# Patient Record
Sex: Female | Born: 1965
Health system: Southern US, Community
[De-identification: ages and names within clinical notes are randomized; demographics above are authoritative.]

## PROBLEM LIST (undated history)

## (undated) DIAGNOSIS — G43909 Migraine, unspecified, not intractable, without status migrainosus: Secondary | ICD-10-CM

## (undated) DIAGNOSIS — T7840XA Allergy, unspecified, initial encounter: Secondary | ICD-10-CM

## (undated) DIAGNOSIS — I10 Essential (primary) hypertension: Secondary | ICD-10-CM

## (undated) DIAGNOSIS — E739 Lactose intolerance, unspecified: Secondary | ICD-10-CM

## (undated) DIAGNOSIS — F329 Major depressive disorder, single episode, unspecified: Secondary | ICD-10-CM

## (undated) DIAGNOSIS — F419 Anxiety disorder, unspecified: Secondary | ICD-10-CM

## (undated) DIAGNOSIS — F32A Depression, unspecified: Secondary | ICD-10-CM

## (undated) DIAGNOSIS — K219 Gastro-esophageal reflux disease without esophagitis: Secondary | ICD-10-CM

## (undated) DIAGNOSIS — R7303 Prediabetes: Secondary | ICD-10-CM

## (undated) DIAGNOSIS — Z91018 Allergy to other foods: Secondary | ICD-10-CM

## (undated) DIAGNOSIS — E538 Deficiency of other specified B group vitamins: Secondary | ICD-10-CM

## (undated) DIAGNOSIS — M2550 Pain in unspecified joint: Secondary | ICD-10-CM

## (undated) DIAGNOSIS — E559 Vitamin D deficiency, unspecified: Secondary | ICD-10-CM

## (undated) DIAGNOSIS — M7989 Other specified soft tissue disorders: Secondary | ICD-10-CM

## (undated) HISTORY — DX: Other specified soft tissue disorders: M79.89

## (undated) HISTORY — DX: Lactose intolerance, unspecified: E73.9

## (undated) HISTORY — DX: Prediabetes: R73.03

## (undated) HISTORY — DX: Vitamin D deficiency, unspecified: E55.9

## (undated) HISTORY — DX: Allergy, unspecified, initial encounter: T78.40XA

## (undated) HISTORY — DX: Anxiety disorder, unspecified: F41.9

## (undated) HISTORY — DX: Essential (primary) hypertension: I10

## (undated) HISTORY — DX: Pain in unspecified joint: M25.50

## (undated) HISTORY — DX: Deficiency of other specified B group vitamins: E53.8

## (undated) HISTORY — DX: Migraine, unspecified, not intractable, without status migrainosus: G43.909

## (undated) HISTORY — DX: Major depressive disorder, single episode, unspecified: F32.9

## (undated) HISTORY — PX: ABDOMINAL HYSTERECTOMY: SHX81

## (undated) HISTORY — DX: Allergy to other foods: Z91.018

## (undated) HISTORY — DX: Depression, unspecified: F32.A

## (undated) HISTORY — DX: Gastro-esophageal reflux disease without esophagitis: K21.9

---

## 1998-07-14 ENCOUNTER — Emergency Department (HOSPITAL_COMMUNITY): Admission: EM | Admit: 1998-07-14 | Discharge: 1998-07-14 | Payer: Self-pay | Admitting: Emergency Medicine

## 2000-04-30 ENCOUNTER — Encounter (INDEPENDENT_AMBULATORY_CARE_PROVIDER_SITE_OTHER): Payer: Self-pay

## 2000-04-30 ENCOUNTER — Inpatient Hospital Stay (HOSPITAL_COMMUNITY): Admission: RE | Admit: 2000-04-30 | Discharge: 2000-05-02 | Payer: Self-pay | Admitting: Obstetrics & Gynecology

## 2004-05-16 ENCOUNTER — Emergency Department (HOSPITAL_COMMUNITY): Admission: EM | Admit: 2004-05-16 | Discharge: 2004-05-16 | Payer: Self-pay | Admitting: Emergency Medicine

## 2004-07-30 ENCOUNTER — Ambulatory Visit (HOSPITAL_COMMUNITY): Admission: RE | Admit: 2004-07-30 | Discharge: 2004-07-30 | Payer: Self-pay | Admitting: Orthopedic Surgery

## 2008-04-04 ENCOUNTER — Ambulatory Visit: Payer: Self-pay | Admitting: Internal Medicine

## 2008-05-18 ENCOUNTER — Emergency Department (HOSPITAL_COMMUNITY): Admission: EM | Admit: 2008-05-18 | Discharge: 2008-05-18 | Payer: Self-pay | Admitting: Family Medicine

## 2008-05-19 ENCOUNTER — Encounter: Payer: Self-pay | Admitting: Family Medicine

## 2008-05-19 ENCOUNTER — Ambulatory Visit: Payer: Self-pay | Admitting: Surgery

## 2008-05-19 ENCOUNTER — Ambulatory Visit: Admission: RE | Admit: 2008-05-19 | Discharge: 2008-05-19 | Payer: Self-pay | Admitting: Family Medicine

## 2008-09-23 ENCOUNTER — Ambulatory Visit: Payer: Self-pay | Admitting: Internal Medicine

## 2009-03-13 ENCOUNTER — Ambulatory Visit: Payer: Self-pay | Admitting: Internal Medicine

## 2009-09-29 ENCOUNTER — Ambulatory Visit: Payer: Self-pay | Admitting: Internal Medicine

## 2010-03-16 ENCOUNTER — Ambulatory Visit: Payer: Self-pay | Admitting: Internal Medicine

## 2010-03-19 ENCOUNTER — Ambulatory Visit: Payer: Self-pay | Admitting: Internal Medicine

## 2010-05-19 ENCOUNTER — Encounter: Payer: Self-pay | Admitting: Cardiology

## 2010-05-20 ENCOUNTER — Encounter: Payer: Self-pay | Admitting: Cardiology

## 2010-05-22 ENCOUNTER — Ambulatory Visit (INDEPENDENT_AMBULATORY_CARE_PROVIDER_SITE_OTHER): Payer: 59 | Admitting: Internal Medicine

## 2010-05-22 DIAGNOSIS — R0789 Other chest pain: Secondary | ICD-10-CM

## 2010-05-22 DIAGNOSIS — I498 Other specified cardiac arrhythmias: Secondary | ICD-10-CM

## 2010-06-08 DIAGNOSIS — F329 Major depressive disorder, single episode, unspecified: Secondary | ICD-10-CM | POA: Insufficient documentation

## 2010-06-08 DIAGNOSIS — F419 Anxiety disorder, unspecified: Secondary | ICD-10-CM | POA: Insufficient documentation

## 2010-06-08 DIAGNOSIS — K219 Gastro-esophageal reflux disease without esophagitis: Secondary | ICD-10-CM

## 2010-06-11 ENCOUNTER — Encounter: Payer: Self-pay | Admitting: Cardiology

## 2010-06-11 ENCOUNTER — Ambulatory Visit (INDEPENDENT_AMBULATORY_CARE_PROVIDER_SITE_OTHER): Payer: 59 | Admitting: Cardiology

## 2010-06-11 DIAGNOSIS — R002 Palpitations: Secondary | ICD-10-CM

## 2010-06-11 DIAGNOSIS — R079 Chest pain, unspecified: Secondary | ICD-10-CM

## 2010-06-11 DIAGNOSIS — I10 Essential (primary) hypertension: Secondary | ICD-10-CM | POA: Insufficient documentation

## 2010-06-21 ENCOUNTER — Other Ambulatory Visit (HOSPITAL_COMMUNITY): Payer: 59

## 2010-06-21 NOTE — Assessment & Plan Note (Signed)
Summary: np6.bigemy. chestpain. per [facility] [phone]. pt has ...   Primary Provider:  Dr. Lenord Fellers  CC:  new patient/bigeminy.  History of Present Illness: 45 yo with history of HTN and strong family history of CAD presents for evaluation of palpitations and chest pain.  Patient has had episodes in the past of mild heart fluttering/palpitations.  On 2/4, she developed a sensation of her heart racing while in the car driving to work.  This was associated with mild chest heaviness.  This lasted about 45 minutes then resolved.  She had not felt symptoms like this before.  She went to the ER in [facility] and was found to be in ventricular bigeminy on initial ECG.  She was admitted overnight and ruled out for MI.  She was sent home the next day and instructed to get a stress test.  Since that time, she has had no further tachycardia episodes.  No chest pain or pressure.  She has had a few brief heart "flutters" since that time.   Caffeine is limited to 1 diet Dr. Reino Kent daily.   ECG ([place] [facility]): NSR with ventricular bigeminy initially, then NSR/normal on repeats.  ECG (today): NSR at 59, normal  Labs (2/12): cardiac enzymes negative x 3, K 3.5, creatinine 0.8, LDL 101, HDL 23  Current Medications (verified): 1)  Bystolic 10 Mg Tabs (Nebivolol Hcl) .... Take One Tablet Once Daily 2)  Hydrochlorothiazide 25 Mg Tabs (Hydrochlorothiazide) .... Take One Tablet Once Daily 3)  Prilosec 20 Mg Cpdr (Omeprazole) .... Take One Tablet Once Daily 4)  Vitamin D 1000 Unit Tabs (Cholecalciferol) .... Two Times A Day 5)  Zoloft 100 Mg Tabs (Sertraline Hcl) .... Take One Tablet Once Daily  Allergies (verified): 1)  ! Pcn  Past History:  Past Medical History: 1. GERD 2. Depression 3. h/o hysterectomy 4. HTN  Family History: Mother with CAD, had PCI at 70 and CABG at 84 Father with MI at 91 Grandfather with CABG  Social History: Tobacco Use - No, quit 2008.  Alcohol Use - no Drug Use -  no Lives in [place].  Works in [place] in [employer].   Review of Systems       All systems reviewed and negative except as per HPI.   Vital Signs:  Patient profile:   45 year old female Height:      61 inches Weight:      238 pounds BMI:     45.13 Pulse rate:   59 / minute Pulse rhythm:   regular BP sitting:   110 / 68  (left arm) Cuff size:   large  Vitals Entered By: Judithe Modest CMA (June 11, 2010 11:17 AM)  Physical Exam  General:  Well developed, well nourished, in no acute distress. Obese.  Head:  normocephalic and atraumatic Nose:  no deformity, discharge, inflammation, or lesions Mouth:  Teeth, gums and palate normal. Oral mucosa normal. Neck:  Neck thick, no JVD. No masses, thyromegaly or abnormal cervical nodes. Lungs:  Clear bilaterally to auscultation and percussion. Heart:  Non-displaced PMI, chest non-tender; regular rate and rhythm, S1, S2 without murmurs, rubs or gallops. Carotid upstroke normal, no bruit. Pedals normal pulses. No edema, no varicosities. Abdomen:  Bowel sounds positive; abdomen soft and non-tender without masses, organomegaly, or hernias noted. No hepatosplenomegaly. Extremities:  No clubbing or cyanosis. Neurologic:  Alert and oriented x 3. Skin:  Intact without lesions or rashes. Psych:  Normal affect.  Impression & Recommendations:  Problem # 1:  PALPITATIONS (ICD-785.1) Patient went to the ER with prolonged tachypalpitations.  ECG initially showed ventricular bigeminy.  No significant palpitations since then.  I am going to have her wear a 48 hour holter to look for PVC burden and to assess for any other arrhythmias.  She should continue her beta blocker.  I will get an echo to make sure that her heart is structurally normal.   Problem # 2:  CHEST PAIN (ICD-786.50) Atypical chest pressure associated with palpitations.  Given her family history, I will get a stress echo to rule out ischemia.     Problem # 3:  HYPERTENSION, UNSPECIFIED (ICD-401.9) BP is under good control.   Other Orders: Echocardiogram (Echo) Stress Echo (Stress Echo) Holter Monitor (Holter Monitor)  Patient Instructions: 1)  Your physician has requested that you have a stress echocardiogram. For further information please visit [email or web address]  Please follow instruction sheet as given. 2)  Your physician has requested that you have an echocardiogram.  Echocardiography is a painless test that uses sound waves to create images of your heart. It provides your doctor with information about the size and shape of your heart and how well your heart's chambers and valves are working.  This procedure takes approximately one hour. There are no restrictions for this procedure. 3)  Your physician has recommended that you wear a holter monitor.  Holter monitors are medical devices that record the heart's electrical activity. Doctors most often use these monitors to diagnose arrhythmias. Arrhythmias are problems with the speed or rhythm of the heartbeat. The monitor is a small, portable device. You can wear one while you do your normal daily activities. This is usually used to diagnose what is causing palpitations/syncope (passing out). 48 hour monitor 4)  Your physician recommends that you schedule a follow-up appointment in: 2 weeks with Dr Shirlee Latch after the testing has been done.

## 2010-06-25 ENCOUNTER — Ambulatory Visit: Payer: 59 | Admitting: Cardiology

## 2010-08-31 NOTE — Op Note (Signed)
[facility]  Patient:    Andrea Thompson, Andrea Thompson                         MRN: [id number] Proc. Date: 04/30/00 Attending:  Caralyn Guile. Arlyce Dice, M.D.                           Operative Report  PREOPERATIVE DIAGNOSIS:       Myoma uteri.  POSTOPERATIVE DIAGNOSIS:      Myoma uteri.  PROCEDURE:                    Total abdominal hysterectomy.  SURGEON:                      Richard D. Arlyce Dice, M.D.  ASSISTANTDebbe Bales A. Edward Jolly, M.D.  ANESTHESIA:                   General endotracheal.  ESTIMATED BLOOD LOSS:         200 cc.  FINDINGS:                     There was a myomatous uterus approximately 12 weeks size.  The ovaries were normal bilaterally.  There was no evidence of endometriosis or adhesions.  The kidneys were palpable bilaterally.  The appendix appeared normal to palpation and inspection.  INDICATIONS:                  This is a 45 year old gravida 3, para 3, who developed heavy periods with flooding over the past year.  In addition, she has had persistent left lower quadrant pain, which has occurred during intercourse, and was more severe during her periods.  On examination she was found to have a 12-14 week myoma, and on ultrasound this confirmed a 6.0 cm myoma, in addition to a slightly enlarged uterus.  Because of the myoma, and the symptoms that appeared to be associated with it, the decision was made to proceed with a hysterectomy.  The risks and alternatives were discussed with the patient prior to proceeding.  DESCRIPTION OF PROCEDURE:     The patient was taken to the operating room and placed in the supine position.  General endotracheal anesthesia was induced. The patient was prepped and draped in a sterile fashion.  The bladder was catheterized.  A low transverse incision was made and carried down into the fascia, which was entered sharply, and extended transversely.  The rectus sheath was then sharply dissected from the  underlying rectus muscle.  The rectus muscle was divided in the midline.  The peritoneum was entered bluntly, and extended by sharp dissection.  The upper abdomen was explored.  The self-retaining OConnor-OSullivan retractor was placed.  The bowels were packed, and the pelvis was examined.  The cornu were grasped with Kelly clamps and elevated.  The round ligaments were cauterized.  The bladder plane was created.  This was done bilaterally.  The bladder was then displaced inferiorly.  Using blunt dissection on the broad ligament, the uterine ovarian anastomosis was isolated, clamped, and doubly ligated.  The uterine arteries were then skeletonized, clamped, and ligated.  The parametrial tissues were then clamped, cut, and ligated.  The vagina was entered laterally, as Satinsky scissors were used to cut the cervix  and uterus free from the vagina.  The angles on each side were secured with figure-of-eight suture.  The cuff was closed with figure-of-eight sutures.  The hemostasis was noted to be present. The bladder flap was used to cover the vaginal cuff.  The packs were removed. The sigmoid was brought down into the pelvis.  The ovaries were left high in the pelvis.  The peritoneum was then closed along with the rectus muscle in the midline.  The fascia was closed with a running #0 Vicryl suture. The skin was closed with subcuticular #5-0 Dexon sutures.  The patient tolerated the procedure well and left the operating room in good condition. DD:  04/30/00 TD:  04/30/00 Job: [id number]

## 2010-08-31 NOTE — Discharge Summary (Signed)
[facility] [place]  Patient:    Andrea Thompson, Andrea Thompson                       MRN: [id number] Adm. Date:  [id number] Disc. Date: [id number] Attending:  Lars Pinks Dictator:   Devoria Albe Edward Jolly, M.D.                           Discharge Summary  ADMISSION DIAGNOSIS:          Symptomatic uterine leiomyomata.  DISCHARGE DIAGNOSES:          1. Symptomatic uterine leiomyomata.                               2. Status post total abdominal hysterectomy.  PROCEDURE:                    [facility] of Dr. Ilda Mori on April 30, 2000.  HISTORY:                      The patient was a 45 year old gravida 3, para 43 Caucasian female with heavy vaginal bleeding and left lower quadrant pain who on physical examination was noted to have a 12-14 week sized uterus and ultrasound evidence of a 6 cm myoma.  The patient wished for definitive therapy for her bleeding and pain and she was consented for a total abdominal hysterectomy.  HOSPITAL COURSE:              The patient underwent a total abdominal hysterectomy under general anesthesia on April 30, 2000.  Estimated blood loss from surgery was 200 cc and there were no complications.                                The patients postoperative course was unremarkable.  The patient remained without any evidence of significant fevers.  The incision demonstrated some mild ecchymosis, but no evidence of any hematoma or drainage from the incision during the hospitalization.                                The patient postoperatively had good pain control with a morphine PCA and she was later converted to Tylox which controlled her pain well.  The patient was slowly advanced to a regular diet which she was tolerating at the time of discharge.  The patient was able to ambulate independently.  The patients postoperative day #1 hematocrit was noted to be  32.5% and her final pathology report is pending at the time of her discharge.  DISPOSITION:                  The patient is discharged to home in good condition on May 02, 2000.  DIET:                         Regular.  ACTIVITY:                     Decreased for the next four weeks.  She will lift nothing over 10 pounds.  She will not undergo any strenuous activity. She will  place nothing in the vagina until seen for her postoperative checkup.  DISCHARGE MEDICATIONS:        1. Tylox one to two p.o. q.4h. p.r.n. pain.                               2. Ibuprofen 600 mg p.o. q.6h. p.r.n. pain.  INSTRUCTIONS:                 The patient will call if she experiences a fever over 100.5 degrees Fahrenheit, nausea and vomiting, drainage or redness around the incision, pain uncontrolled by her medication, increased vaginal bleeding, or any other concern. DD:  05/02/00 TD:  05/02/00 Job: [id number]

## 2010-08-31 NOTE — H&P (Signed)
[facility]  Patient:    Andrea Thompson, Andrea Thompson                         MRN: [id number] Adm. Date:  04/30/00 Attending:  Caralyn Guile. Arlyce Dice, M.D.                         History and Physical  HISTORY OF PRESENT ILLNESS:   This is a 45 year old, gravida 3, para 3, who was admitted with symptomatic uterine fibroids for a total abdominal hysterectomy.  The patient presented to the office with a history of severe abdominal pain.  The patient states that she has been having severe left-sided pain for approximately one week that is constant and awakens her at night. She has been taking Advil continuously throughout the week.  She also notices pain with intercourse.  The patient denies painful urination.  The patient denies any nausea, vomiting, constipation, or diarrhea.  In addition, the patient states that over the past year she has been having very heavy menses and other episodes of left-sided abdominal pain.  She states that during the heavy periods she has to use pads and tampons which she changes every hour. If she does not, she says that she soils her underwear.  She says that this heavy bleeding continues for six to seven days.  PAST MEDICAL HISTORY:         She has had no previous hospitalizations or surgery.  She has no medical problems.  The patient denies any blood transfusions.  ALLERGIES:                    She has no known drug allergies.  MEDICATIONS:                  The only medication that she is taking at this time is Advil for pain.  FAMILY HISTORY:               Her mother has had diabetes, heart disease, and high blood pressure.  There is no history of breast cancer in the family.  SOCIAL HISTORY:               She smokes one pack a day and she has been doing so for 18 years.  She drinks two cups of coffee a day.  She denies IV drug use.  She has never used addicting drugs.  REVIEW OF SYSTEMS:            Negative in detail.  PHYSICAL  EXAMINATION:         She is 5 feet 1 inch tall and weighs 182 pounds.  VITAL SIGNS:                  Blood pressure 124/82, pulse 80 and regular.  HEENT:                        Normal.  NECK:                         Supple without thyromegaly.  BREASTS:                      Without masses.  HEART:                        Regular  sinus rhythm without murmurs or gallops.  LUNGS:                        Clear to auscultation and percussion.  LYMPHATICS:                   There is no lymphadenopathy.  PELVIC:                       Her external genitalia, vagina, and cervix were all normal.  Her uterus was enlarged and irregular to approximately 14 weeks size.  There were no adnexal masses palpable.  PREOPERATIVE LABORATORY WORK:                         She had a benign Pap smear.  An ultrasound done on March 28, 2000, showed a uterus that was measuring 12 x 8 cm and a 6 x 5 large uterine myoma was noted on the fundus.  The endometrium appeared normal.  The left ovary appeared normal.  The right ovary was not visualized.  ADMISSION DIAGNOSIS:          Myoma uteri with significant symptoms of pain and menorrhagia.  HOSPITAL PLAN:                To do a total abdominal hysterectomy and only remove her ovaries if significant pathology is seen. DD:  04/29/00 TD:  04/29/00 Job: [id number]

## 2010-09-17 ENCOUNTER — Telehealth: Payer: Self-pay | Admitting: Internal Medicine

## 2010-09-17 NOTE — Telephone Encounter (Signed)
Suggest we make this just a 6 month OV instead with minimal labs like a B-met. She can come in and discuss paying for lab with Misty. Loney Loh gives a discount to [employer]. Suspect this will be a 99214 visit if that helps define the costs to her.

## 2010-09-17 NOTE — Telephone Encounter (Signed)
Advised pt to change CPE to 6 mo ov p/mjb.  Gave her approximate cost of 16109 at $185.  Advised her to have BMET only and come in and discuss price w/Misty p/mjb

## 2010-10-02 ENCOUNTER — Encounter: Payer: Self-pay | Admitting: Internal Medicine

## 2010-10-02 ENCOUNTER — Ambulatory Visit (INDEPENDENT_AMBULATORY_CARE_PROVIDER_SITE_OTHER): Payer: Self-pay | Admitting: Internal Medicine

## 2010-10-02 DIAGNOSIS — K219 Gastro-esophageal reflux disease without esophagitis: Secondary | ICD-10-CM

## 2010-10-02 DIAGNOSIS — F419 Anxiety disorder, unspecified: Secondary | ICD-10-CM

## 2010-10-02 DIAGNOSIS — J039 Acute tonsillitis, unspecified: Secondary | ICD-10-CM

## 2010-10-02 DIAGNOSIS — F32A Depression, unspecified: Secondary | ICD-10-CM

## 2010-10-02 DIAGNOSIS — F329 Major depressive disorder, single episode, unspecified: Secondary | ICD-10-CM

## 2010-10-02 DIAGNOSIS — I498 Other specified cardiac arrhythmias: Secondary | ICD-10-CM

## 2010-10-02 DIAGNOSIS — I10 Essential (primary) hypertension: Secondary | ICD-10-CM

## 2010-10-02 DIAGNOSIS — Z8669 Personal history of other diseases of the nervous system and sense organs: Secondary | ICD-10-CM

## 2010-10-02 DIAGNOSIS — F411 Generalized anxiety disorder: Secondary | ICD-10-CM

## 2010-10-02 DIAGNOSIS — E669 Obesity, unspecified: Secondary | ICD-10-CM

## 2010-10-02 DIAGNOSIS — J309 Allergic rhinitis, unspecified: Secondary | ICD-10-CM

## 2010-10-02 DIAGNOSIS — E559 Vitamin D deficiency, unspecified: Secondary | ICD-10-CM

## 2010-10-02 DIAGNOSIS — Z0000 Encounter for general adult medical examination without abnormal findings: Secondary | ICD-10-CM

## 2010-10-02 LAB — POCT URINALYSIS DIPSTICK
Bilirubin, UA: NEGATIVE
Blood, UA: NEGATIVE
Leukocytes, UA: NEGATIVE
Nitrite, UA: NEGATIVE
Protein, UA: NEGATIVE
pH, UA: 5

## 2010-10-02 LAB — CBC WITH DIFFERENTIAL/PLATELET
Basophils Absolute: 0 10*3/uL (ref 0.0–0.1)
Basophils Relative: 0 % (ref 0–1)
Eosinophils Absolute: 0.1 10*3/uL (ref 0.0–0.7)
Eosinophils Relative: 1 % (ref 0–5)
Lymphs Abs: 2.6 10*3/uL (ref 0.7–4.0)
MCH: 30.8 pg (ref 26.0–34.0)
MCV: 88 fL (ref 78.0–100.0)
Neutrophils Relative %: 66 % (ref 43–77)
Platelets: 326 10*3/uL (ref 150–400)
RBC: 4.25 MIL/uL (ref 3.87–5.11)
RDW: 13.2 % (ref 11.5–15.5)

## 2010-10-02 NOTE — Progress Notes (Signed)
Subjective:    Patient ID: Andrea Thompson, female    DOB: Jun 21, 1965, 45 y.o.   MRN: [id number]  HPI patient in today for physical examination and followup on hypertension. She is also-year-old with sore throat and headache since June 15. Exposed to strep throat at work. History of allergic rhinitis, GE reflux, migraine headaches, hypertension, anxiety, vitamin D deficiency. Blood pressure well controlled on Bystolic 10 mg daily and HCTZ 25 mg daily. She is on Protonix 40 mg daily for GE reflux. Takes Xanax 0.5 mg twice daily when necessary anxiety. Is supposed to be taking vitamin D 3 2000 units daily over-the-counter from vitamin D deficiency. Is on Zoloft 100 mg daily for anxiety/depression. Takes Zyrtec when necessary for allergic rhinitis. OB/GYN physician is Dr. Raiford Noble 1. She has not seen him this past year and needs to do so and have a mammogram through his office. Had hysterectomy without oophorectomy 2002 for menorrhagia. 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 Last tetanus immunization 1999. This needs followup at next visit. She is ill today I did not want to give her a tetanus vaccine. Rapid strep screen today is negative.  Family history remarkable for father who died at 92 due to lung cancer. Mother age 38 with diabetes and heart disease. One brother in good health. 3 children in good health.  Patient seen at [facility]   February 2012 for bigeminy stress echocardiogram was negative. Patient quit smoking in 2008.    Review of Systems  Constitutional: Positive for fatigue. Negative for appetite change.  HENT: Positive for congestion, sore throat and trouble swallowing. Negative for ear pain and neck stiffness.   Eyes: Negative for discharge and redness.  Respiratory: Negative for apnea, chest tightness and wheezing.   Cardiovascular: Negative for chest pain and palpitations.  Gastrointestinal: Negative for diarrhea, constipation, blood in stool and  abdominal distention.  Genitourinary: Negative for dysuria, flank pain and difficulty urinating.  Musculoskeletal: Positive for back pain. Negative for joint swelling and arthralgias.  Neurological: Positive for headaches. Negative for dizziness, tremors and weakness.  Hematological: Does not bruise/bleed easily.  Psychiatric/Behavioral: Positive for dysphoric mood. Negative for confusion.       Objective:   Physical Exam  Constitutional: She is oriented to person, place, and time. She appears well-developed and well-nourished. She appears distressed.  HENT:  Head: Normocephalic and atraumatic.  Right Ear: External ear normal.  Left Ear: External ear normal.  Mouth/Throat: Oropharyngeal exudate present.  Eyes: Conjunctivae and EOM are normal. Pupils are equal, round, and reactive to light.  Neck: Neck supple. No JVD present. No thyromegaly present.  Cardiovascular: Normal rate, regular rhythm and normal heart sounds.   No murmur heard. Pulmonary/Chest: Effort normal and breath sounds normal. No respiratory distress. She has no wheezes. She has no rales.  Abdominal: Soft. Bowel sounds are normal. She exhibits no distension and no mass. There is no tenderness.  Musculoskeletal: She exhibits no edema.  Lymphadenopathy:    She has no cervical adenopathy.  Neurological: She is alert and oriented to person, place, and time. She has normal reflexes. She displays normal reflexes. No cranial nerve deficit. Coordination normal.  Skin: Skin is warm and dry. She is not diaphoretic.  Psychiatric: Judgment and thought content normal.       Depressed affect          Assessment & Plan:  Tonsillitis HTN Hx of bigeminy Allergic rhinitis GE reflux Obesity Anxiety depression Vitamin D deficiency Plan is  to be out of work today. Treat tonsillitis with Biaxin 500 mg bid for 10 days. For headache, Vicodin 5/500 (#30)one tab by mouth q 6 hours as needed. No refill. Continue current chronic  meds.

## 2010-10-02 NOTE — Patient Instructions (Signed)
Take meds as prescribed. Note to be out of work today. Diet, exercise and weight loss. Return in 6 months. Samples of Bystolic 10 mg given.

## 2010-10-03 ENCOUNTER — Encounter: Payer: Self-pay | Admitting: Internal Medicine

## 2010-10-03 LAB — COMPREHENSIVE METABOLIC PANEL
ALT: 8 U/L (ref 0–35)
AST: 8 U/L (ref 0–37)
Alkaline Phosphatase: 84 U/L (ref 39–117)
Creat: 0.61 mg/dL (ref 0.50–1.10)
Sodium: 141 mEq/L (ref 135–145)
Total Bilirubin: 0.4 mg/dL (ref 0.3–1.2)
Total Protein: 7.2 g/dL (ref 6.0–8.3)

## 2010-10-03 LAB — TSH: TSH: 2.414 u[IU]/mL (ref 0.350–4.500)

## 2010-10-03 LAB — LIPID PANEL
Cholesterol: 137 mg/dL (ref 0–200)
LDL Cholesterol: 96 mg/dL (ref 0–99)
Total CHOL/HDL Ratio: 5.3 Ratio
VLDL: 15 mg/dL (ref 0–40)

## 2010-10-03 LAB — VITAMIN D 25 HYDROXY (VIT D DEFICIENCY, FRACTURES): Vit D, 25-Hydroxy: 45 ng/mL (ref 30–89)

## 2011-04-04 ENCOUNTER — Ambulatory Visit (INDEPENDENT_AMBULATORY_CARE_PROVIDER_SITE_OTHER): Payer: PRIVATE HEALTH INSURANCE | Admitting: Internal Medicine

## 2011-04-04 ENCOUNTER — Encounter: Payer: Self-pay | Admitting: Internal Medicine

## 2011-04-04 DIAGNOSIS — I10 Essential (primary) hypertension: Secondary | ICD-10-CM

## 2011-04-04 DIAGNOSIS — Z8669 Personal history of other diseases of the nervous system and sense organs: Secondary | ICD-10-CM

## 2011-04-04 DIAGNOSIS — K219 Gastro-esophageal reflux disease without esophagitis: Secondary | ICD-10-CM

## 2011-04-04 DIAGNOSIS — F411 Generalized anxiety disorder: Secondary | ICD-10-CM

## 2011-04-04 DIAGNOSIS — F419 Anxiety disorder, unspecified: Secondary | ICD-10-CM

## 2011-04-04 DIAGNOSIS — F329 Major depressive disorder, single episode, unspecified: Secondary | ICD-10-CM

## 2011-04-04 DIAGNOSIS — F32A Depression, unspecified: Secondary | ICD-10-CM

## 2011-04-14 ENCOUNTER — Encounter: Payer: Self-pay | Admitting: Internal Medicine

## 2011-04-14 DIAGNOSIS — F419 Anxiety disorder, unspecified: Secondary | ICD-10-CM | POA: Insufficient documentation

## 2011-04-14 NOTE — Progress Notes (Signed)
  Subjective:    Patient ID: Andrea Thompson, female    DOB: [birth year], 45 y.o.   MRN: [id number]  HPI 45 year old white female with history of allergic rhinitis, GE reflux, migraine headaches, hypertension, anxiety, depression, vitamin D deficiency, smoking history for six-month recheck. Patient evaluated February 2012 by cardiologist for chest pain and palpitations. History of ventricular bigeminy. Chest pain was determined to be atypical chest pain. Patient had stress echocardiogram which proved to be normal.    Review of Systems     Objective:   Physical Exam chest clear to auscultation; cardiac exam regular rate and rhythm; extremities without edema        Assessment & Plan:  Hypertension  GE reflux  History of migraine headaches  History of atypical chest pain  History of ventricular bigeminy  History of smoking  History of vitamin D deficiency  History of anxiety depression  Plan: Refill Xanax for 6 months. Samples of Bystolic given. Had influenza immunization through [employer] where she is employed. Return in 6 months

## 2011-04-14 NOTE — Patient Instructions (Signed)
Continue same medications for hypertension and anxiety. Return in 6 months for physical exam.

## 2011-07-18 ENCOUNTER — Other Ambulatory Visit: Payer: Self-pay | Admitting: Internal Medicine

## 2011-07-26 ENCOUNTER — Encounter: Payer: Self-pay | Admitting: Internal Medicine

## 2011-07-26 ENCOUNTER — Ambulatory Visit (INDEPENDENT_AMBULATORY_CARE_PROVIDER_SITE_OTHER): Payer: PRIVATE HEALTH INSURANCE | Admitting: Internal Medicine

## 2011-07-26 VITALS — BP 118/60 | HR 68 | Temp 98.2°F | Wt 237.0 lb

## 2011-07-26 DIAGNOSIS — B999 Unspecified infectious disease: Secondary | ICD-10-CM

## 2011-07-26 DIAGNOSIS — Z23 Encounter for immunization: Secondary | ICD-10-CM

## 2011-07-26 DIAGNOSIS — L03012 Cellulitis of left finger: Secondary | ICD-10-CM

## 2011-07-26 DIAGNOSIS — L03019 Cellulitis of unspecified finger: Secondary | ICD-10-CM

## 2011-07-26 NOTE — Patient Instructions (Signed)
Take antibiotics twice daily as prescribed with food. Call if not better in 5-7 days or sooner if worse.

## 2011-07-26 NOTE — Progress Notes (Signed)
  Subjective:    Patient ID: Andrea Thompson, female    DOB: 1965-10-03, 46 y.o.   MRN: [id number]  HPI 46 year old white female with history of anxiety depression and hypertension. She had her nails done in a nail salon about 4 weeks ago. Began to have some tenderness about her left thumb nail bed. This has persisted despite soaking thumb in Epsom salts  Water. Now has noticed swelling in left h. She Is Right-Handed. No fever. No redness of left hand. No drainage about nail bed.     Review of Systems     Objective:   Physical Exam  patient has dry cuticle left thumb. Tender along medial left thumb without redness or drainage. Entire left hand is puffy and has increased warmth but is not red.        Assessment & Plan:  Probable paronychia left thumb with possible early cellulitis left hand  Plan: CBC with differential. Cefzil 500 mg twice daily for 7 days. Call if not better next week or sooner if worse. Tetanus immunization update given today.

## 2011-07-27 LAB — CBC WITH DIFFERENTIAL/PLATELET
Basophils Absolute: 0.1 10*3/uL (ref 0.0–0.1)
Basophils Relative: 1 % (ref 0–1)
Eosinophils Absolute: 0.1 10*3/uL (ref 0.0–0.7)
HCT: 36.4 % (ref 36.0–46.0)
Hemoglobin: 12.5 g/dL (ref 12.0–15.0)
MCH: 29.6 pg (ref 26.0–34.0)
MCHC: 34.3 g/dL (ref 30.0–36.0)
Monocytes Absolute: 0.5 10*3/uL (ref 0.1–1.0)
Monocytes Relative: 5 % (ref 3–12)
Neutrophils Relative %: 61 % (ref 43–77)
RDW: 13.2 % (ref 11.5–15.5)

## 2011-09-03 ENCOUNTER — Ambulatory Visit (INDEPENDENT_AMBULATORY_CARE_PROVIDER_SITE_OTHER): Payer: PRIVATE HEALTH INSURANCE | Admitting: Internal Medicine

## 2011-09-03 ENCOUNTER — Encounter: Payer: Self-pay | Admitting: Internal Medicine

## 2011-09-03 VITALS — BP 116/60 | HR 68 | Temp 98.5°F | Wt 236.0 lb

## 2011-09-03 DIAGNOSIS — L03019 Cellulitis of unspecified finger: Secondary | ICD-10-CM

## 2011-09-03 DIAGNOSIS — L03012 Cellulitis of left finger: Secondary | ICD-10-CM

## 2011-09-03 DIAGNOSIS — I10 Essential (primary) hypertension: Secondary | ICD-10-CM

## 2011-09-03 DIAGNOSIS — Z0000 Encounter for general adult medical examination without abnormal findings: Secondary | ICD-10-CM

## 2011-09-03 NOTE — Patient Instructions (Signed)
Continue Bystolic for hypertension. We can draw MMR titers if you cannot locate her immunization records. Take doxycycline 100 mg twice daily for 10 days for possible paronychia. If symptoms recur after this treatment, we will refer you to hand surgeon.

## 2011-09-03 NOTE — Progress Notes (Signed)
  Subjective:    Patient ID: Andrea Thompson, female    DOB: 1965/12/28, 46 y.o.   MRN: [id number]  HPI Pt was seen April 12 & was thought to have paronychia left which was treated with Cefzil 500 mg twice daily for 7 days. Patient said symptoms improved. Says pain in left thumb  had resolved. However recently, she noticed redness and soreness left medial thumb adjacent to the nail. Has not been to manicurist since last visit. No pus or drainage noted.  No pain with flexion of thumb.  Also employer wants her to have documentation of MMR titer or vaccine. She has written to her high school to see if those records exist. She was born after [birth year]. Doesn't recall having these diseases. Told her today we can draw these titers if she cannot find her immunization records. GYN did not have rubella titer records.  History of hypertension      Review of Systems     Objective:   Physical Exam slight erythema adjacent to left medial thumb nail. No drainage noted. Full range of motion left palm. No redness of hand or swelling.        Assessment & Plan:  Probable paronychia left thumb  Plan: Doxycycline 100 mg twice daily for 10 days. If symptoms recur or do not improve, refer to hand surgeon  Samples of Bystolic 10 mg given for treatment of hypertension.

## 2011-09-24 ENCOUNTER — Telehealth: Payer: Self-pay | Admitting: Internal Medicine

## 2011-09-24 NOTE — Telephone Encounter (Signed)
.   Pt will call back on Wednesday.  Attempted to cancel message but it still created it for some reason even though I didn't "route" it to anyone.

## 2011-10-07 ENCOUNTER — Other Ambulatory Visit: Payer: Self-pay | Admitting: Internal Medicine

## 2011-10-07 ENCOUNTER — Other Ambulatory Visit: Payer: PRIVATE HEALTH INSURANCE | Admitting: Internal Medicine

## 2011-10-07 DIAGNOSIS — I10 Essential (primary) hypertension: Secondary | ICD-10-CM

## 2011-10-07 LAB — CBC WITH DIFFERENTIAL/PLATELET
Basophils Absolute: 0 10*3/uL (ref 0.0–0.1)
Basophils Relative: 1 % (ref 0–1)
Eosinophils Absolute: 0.1 10*3/uL (ref 0.0–0.7)
Eosinophils Relative: 2 % (ref 0–5)
MCH: 30.3 pg (ref 26.0–34.0)
MCV: 85.1 fL (ref 78.0–100.0)
Neutrophils Relative %: 58 % (ref 43–77)
Platelets: 282 10*3/uL (ref 150–400)
RDW: 13.6 % (ref 11.5–15.5)

## 2011-10-07 LAB — LIPID PANEL
Cholesterol: 151 mg/dL (ref 0–200)
LDL Cholesterol: 100 mg/dL — ABNORMAL HIGH (ref 0–99)
Total CHOL/HDL Ratio: 5 Ratio
VLDL: 21 mg/dL (ref 0–40)

## 2011-10-07 LAB — COMPREHENSIVE METABOLIC PANEL
ALT: 13 U/L (ref 0–35)
AST: 10 U/L (ref 0–37)
Alkaline Phosphatase: 65 U/L (ref 39–117)
Creat: 0.66 mg/dL (ref 0.50–1.10)
Total Bilirubin: 0.4 mg/dL (ref 0.3–1.2)

## 2011-10-07 LAB — TSH: TSH: 2.475 u[IU]/mL (ref 0.350–4.500)

## 2011-10-08 LAB — VITAMIN D 25 HYDROXY (VIT D DEFICIENCY, FRACTURES): Vit D, 25-Hydroxy: 37 ng/mL (ref 30–89)

## 2011-10-10 ENCOUNTER — Other Ambulatory Visit: Payer: PRIVATE HEALTH INSURANCE | Admitting: Internal Medicine

## 2011-10-11 ENCOUNTER — Encounter: Payer: Self-pay | Admitting: Internal Medicine

## 2011-10-11 ENCOUNTER — Ambulatory Visit (INDEPENDENT_AMBULATORY_CARE_PROVIDER_SITE_OTHER): Payer: PRIVATE HEALTH INSURANCE | Admitting: Internal Medicine

## 2011-10-11 VITALS — BP 118/78 | HR 80 | Ht 63.0 in | Wt 240.0 lb

## 2011-10-11 DIAGNOSIS — K589 Irritable bowel syndrome without diarrhea: Secondary | ICD-10-CM

## 2011-10-11 DIAGNOSIS — Z0000 Encounter for general adult medical examination without abnormal findings: Secondary | ICD-10-CM

## 2011-10-11 DIAGNOSIS — I10 Essential (primary) hypertension: Secondary | ICD-10-CM

## 2011-10-11 DIAGNOSIS — F411 Generalized anxiety disorder: Secondary | ICD-10-CM

## 2011-10-11 DIAGNOSIS — F419 Anxiety disorder, unspecified: Secondary | ICD-10-CM

## 2011-10-11 LAB — POCT URINALYSIS DIPSTICK
Bilirubin, UA: NEGATIVE
Blood, UA: NEGATIVE
Nitrite, UA: NEGATIVE
pH, UA: 6

## 2011-10-12 ENCOUNTER — Encounter: Payer: Self-pay | Admitting: Internal Medicine

## 2011-10-12 DIAGNOSIS — K589 Irritable bowel syndrome without diarrhea: Secondary | ICD-10-CM | POA: Insufficient documentation

## 2011-10-12 NOTE — Patient Instructions (Addendum)
Continue same medications and return in 6 months for office visit blood pressure check. FSH will be added to your recent lab work to see if you are becoming menopausal

## 2011-10-12 NOTE — Progress Notes (Signed)
Subjective:    Patient ID: Andrea Thompson, female    DOB: 08/18/1965, 46 y.o.   MRN: [id number]  HPI 46 year old white female in today for health maintenance and evaluation of medical problems. Has new problem to discuss today. Has noticed when she goes out for certain restaurants such as [place], seafood restaurants, [employer] she has onset of diarrhea some 30 minutes to one hour after eating food if these restaurants. Says she's never had this previously. She's concerned about it. Initially she thought she might be lactose intolerant and tried giving up milk products. Thinks it might have helped a bit but sees more correlation with certain restaurant food. History of hypertension, anxiety, migraine headaches. 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 She could not find her prenatal records saying whether or not she had been tested for rubella. Says she had a titer done through employment and was told she was negative for measles and would have to take an injection in the near future. She's not sure if she was negative for rubella or rubeola. This is do to a recent measles exposure at [facility] regarding a patient who traveled to [place] but had not been immunized.  Patient has history of hysterectomy without oophorectomy 2002 done by Dr. Alysia Penna for menorrhagia. Sees him for GYN care and has mammogram done through his office. Also has history of GE reflux, allergic rhinitis, vitamin D deficiency. She quit smoking in 2008. Does not consume alcohol or use drugs.  In February 2012, she was found to have ventricular bigeminy in an emergency department evaluation when she presented for chest pain and palpitations. At that time she was on Bystolic for hypertension she had a stress echocardiogram which was normal. She was reassured she was at low risk for coronary artery disease.  Family history mother has history of  coronary artery disease and had PCI 51 and CABG at age 32. Father had MI at age 40. Grandfather with CABG.  Father died of lung cancer in December 07, 2002. One brother in good health. Mother still living.  Social history patient is married. Has one son and 2 daughters all of whom are in good health.  Social history she is Scientist, physiological for [employer]. Husband works for [employer]. She has a GED degree. Previously smoked a pack per day for some 18 years.    Review of Systems  Constitutional: Positive for fatigue.  HENT: Positive for rhinorrhea.        History of allergic rhinitis  Eyes: Negative.   Respiratory: Negative.   Cardiovascular: Positive for palpitations.       History of ventricular bigeminy  Genitourinary:       Having hot flashes. FSH drawn  Neurological:       History of migraine headaches  Hematological: Negative.   Psychiatric/Behavioral:       History of anxiety       Objective:   Physical Exam  Vitals reviewed. Constitutional: She is oriented to person, place, and time. She appears well-nourished. No distress.  HENT:  Head: Normocephalic and atraumatic.  Right Ear: External ear normal.  Left Ear: External ear normal.  Mouth/Throat: Oropharynx is clear and moist. No oropharyngeal exudate.  Eyes: Conjunctivae and EOM are normal. Pupils are equal, round, and reactive to light. Right eye exhibits no discharge. Left eye exhibits no discharge.  Neck: Neck supple. No JVD present. No thyromegaly present.  Cardiovascular:  Normal rate, regular rhythm, normal heart sounds and intact distal pulses.  Exam reveals no gallop.   No murmur heard. Pulmonary/Chest: Effort normal and breath sounds normal. No respiratory distress. She has no wheezes. She has no rales.       Breasts normal female without masses  Abdominal: Soft. Bowel sounds are normal. She exhibits no distension and no mass. There is no rebound and no guarding.  Genitourinary:       Deferred to  OB/GYN.  Musculoskeletal: Normal range of motion. She exhibits no edema.  Lymphadenopathy:    She has no cervical adenopathy.  Neurological: She is alert and oriented to person, place, and time. She has normal reflexes. She displays normal reflexes. She exhibits normal muscle tone. Coordination normal.  Skin: Skin is warm and dry. No rash noted. She is not diaphoretic.  Psychiatric: She has a normal mood and affect. Her behavior is normal. Judgment and thought content normal.          Assessment & Plan:  Hypertension  History of anxiety  GE reflux  Allergic rhinitis  History of migraine headaches  History of palpitations and ventricular bigeminy  History of depression treated with Zoloft  Former smoker quit 2008  Family history of coronary artery disease  Irritable bowel syndrome-patient advised to take Xanax one hour before eating out which may help control diarrhea. Other option is to take Imodium tablet one hour before eating out. Since she does this rarely I don't see any reason to put her on chronic medication. Explained issues with irritable bowel syndrome with her. She needs to avoid foods that set this off.  Plan: Patient to return in 6 months. Samples of Bystolic given. Check FSH level. Patient may be perimenopausal with history of recent onset of hot flashes. Still has ovaries and is status post hysterectomy.

## 2012-03-30 ENCOUNTER — Ambulatory Visit (INDEPENDENT_AMBULATORY_CARE_PROVIDER_SITE_OTHER): Payer: PRIVATE HEALTH INSURANCE | Admitting: Internal Medicine

## 2012-03-30 ENCOUNTER — Encounter: Payer: Self-pay | Admitting: Internal Medicine

## 2012-03-30 VITALS — BP 122/68 | HR 76 | Temp 99.0°F | Wt 236.0 lb

## 2012-03-30 DIAGNOSIS — R071 Chest pain on breathing: Secondary | ICD-10-CM

## 2012-03-30 DIAGNOSIS — R0789 Other chest pain: Secondary | ICD-10-CM

## 2012-03-30 NOTE — Progress Notes (Signed)
  Subjective:    Patient ID: Andrea Thompson, female    DOB: [birth year], 46 y.o.   MRN: [id number]  HPI 46 year old white female with pain in her right parascapular area medial aspect for several days radiating around her right rib cage area to anterior chest. She participated  with the food drive last week at [facility] and was picking up cans of food. She tried ibuprofen which irritated her stomach. She tried antianxiety medication but that did not help. She has become worried about this because the persistent pain. Until we went over her recent activities, it never occurred to her that this could be musculoskeletal pain.    Review of Systems     Objective:   Physical Exam HEENT exam: TMs and pharynx are clear. Neck is supple without adenopathy or thyromegaly. Chest clear to auscultation. Cardiac exam regular rate and rhythm normal S1 and S2. Tender right parascapular area radiating around right rib cage. Point tenderness right parascapular area.        Assessment & Plan:  Chest wall pain  Plan: Vicodin 5/500 (#40) 1 by mouth every 6-8 hours when necessary pain.

## 2012-04-10 ENCOUNTER — Encounter: Payer: Self-pay | Admitting: Internal Medicine

## 2012-04-10 ENCOUNTER — Ambulatory Visit (INDEPENDENT_AMBULATORY_CARE_PROVIDER_SITE_OTHER): Payer: PRIVATE HEALTH INSURANCE | Admitting: Internal Medicine

## 2012-04-10 VITALS — BP 124/66 | HR 68 | Temp 98.4°F | Wt 236.0 lb

## 2012-04-10 DIAGNOSIS — J9801 Acute bronchospasm: Secondary | ICD-10-CM

## 2012-04-10 DIAGNOSIS — H6690 Otitis media, unspecified, unspecified ear: Secondary | ICD-10-CM

## 2012-04-10 DIAGNOSIS — J029 Acute pharyngitis, unspecified: Secondary | ICD-10-CM

## 2012-04-10 DIAGNOSIS — J40 Bronchitis, not specified as acute or chronic: Secondary | ICD-10-CM

## 2012-04-10 DIAGNOSIS — H6693 Otitis media, unspecified, bilateral: Secondary | ICD-10-CM

## 2012-04-10 MED ORDER — CEFTRIAXONE SODIUM 1 G IJ SOLR
1.0000 g | Freq: Once | INTRAMUSCULAR | Status: AC
  Administered 2012-04-10: 1 g via INTRAMUSCULAR

## 2012-04-10 NOTE — Progress Notes (Signed)
  Subjective:    Patient ID: Andrea Thompson, female    DOB: Oct 30, 1965, 46 y.o.   MRN: [id number]  HPI 46 year old white female  works at [employer] in today with URI symptoms started with cough on December 21. Has had shaking chills and fever. She did a rapid flu test at her work which was negative. She did take influenza immunization. Has felt a bit wheezy , short of breath and very fatigued. Bringing up some discolored sputum. Pulse oximetry is 98% on room air    Review of Systems     Objective:   Physical Exam both TMs are thick and dull. Pharynx is red. Neck is supple. No significant adenopathy. Intermittent wheezing with coughing on chest exam. No rales appreciated. Skin is warm and dry.        Assessment & Plan:  Acute bronchospasm  Bronchitis  Pharyngitis  Bilateral otitis media  Plan: Sterapred DS 10 mg 6 day dosepak. Hycodan 8 ounces 1 teaspoon by mouth Q8 hours when necessary cough. Levaquin 500 milligrams daily for 10 days. Ventolin inhaler 2 sprays by mouth 4 times a day when necessary cough and wheezing.

## 2012-04-10 NOTE — Patient Instructions (Addendum)
Human given an injection of Rocephin 1 g IM. Start Levaquin 500 milligrams daily for 10 days. Take Sterapred DS 10 mg 6 day dosepak. Use Ventolin inhaler. Take hydrocodone cough syrup.

## 2012-04-13 ENCOUNTER — Ambulatory Visit: Payer: PRIVATE HEALTH INSURANCE | Admitting: Internal Medicine

## 2012-04-17 NOTE — Patient Instructions (Addendum)
Vicodin 5/500 one by mouth Q8 hours when necessary pain

## 2012-06-04 ENCOUNTER — Ambulatory Visit: Payer: PRIVATE HEALTH INSURANCE | Admitting: Internal Medicine

## 2012-06-22 ENCOUNTER — Ambulatory Visit (INDEPENDENT_AMBULATORY_CARE_PROVIDER_SITE_OTHER): Payer: PRIVATE HEALTH INSURANCE | Admitting: Internal Medicine

## 2012-06-22 ENCOUNTER — Encounter: Payer: Self-pay | Admitting: Internal Medicine

## 2012-06-22 VITALS — BP 124/72 | HR 68 | Temp 97.4°F | Wt 234.0 lb

## 2012-06-22 DIAGNOSIS — I10 Essential (primary) hypertension: Secondary | ICD-10-CM

## 2012-06-22 DIAGNOSIS — K589 Irritable bowel syndrome without diarrhea: Secondary | ICD-10-CM

## 2012-06-22 DIAGNOSIS — F4321 Adjustment disorder with depressed mood: Secondary | ICD-10-CM

## 2012-06-22 DIAGNOSIS — F411 Generalized anxiety disorder: Secondary | ICD-10-CM
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
## 2012-06-22 NOTE — Patient Instructions (Addendum)
Takes Xanax 0.5 mg twice daily as needed for anxiety or grief. Also can use half of a Xanax tablet one hour before going out to eat for irritable bowel syndrome. Continue Bystolic. Return in 6 months for physical exam.

## 2012-07-14 ENCOUNTER — Other Ambulatory Visit: Payer: Self-pay | Admitting: Internal Medicine

## 2012-09-22 ENCOUNTER — Other Ambulatory Visit: Payer: Self-pay | Admitting: *Deleted

## 2012-09-22 DIAGNOSIS — I10 Essential (primary) hypertension: Secondary | ICD-10-CM

## 2012-09-22 MED ORDER — NEBIVOLOL HCL 10 MG PO TABS: 10.0000 mg | ORAL_TABLET | Freq: Every day | ORAL | Status: DC

## 2012-11-19 ENCOUNTER — Other Ambulatory Visit: Payer: Self-pay | Admitting: Obstetrics & Gynecology

## 2012-11-19 DIAGNOSIS — R928 Other abnormal and inconclusive findings on diagnostic imaging of breast: Secondary | ICD-10-CM

## 2012-12-04 ENCOUNTER — Ambulatory Visit
Admission: RE | Admit: 2012-12-04 | Discharge: 2012-12-04 | Disposition: A | Discharge status: Home / Self Care | Payer: No Typology Code available for payment source | Source: Ambulatory Visit | Attending: Obstetrics & Gynecology | Admitting: Obstetrics & Gynecology

## 2012-12-04 DIAGNOSIS — R928 Other abnormal and inconclusive findings on diagnostic imaging of breast: Secondary | ICD-10-CM

## 2013-03-25 ENCOUNTER — Encounter: Payer: Self-pay | Admitting: Internal Medicine

## 2013-03-25 ENCOUNTER — Ambulatory Visit (INDEPENDENT_AMBULATORY_CARE_PROVIDER_SITE_OTHER): Payer: No Typology Code available for payment source | Admitting: Internal Medicine

## 2013-03-25 VITALS — BP 136/80 | HR 68 | Temp 97.7°F | Ht 63.0 in | Wt 232.0 lb

## 2013-03-25 DIAGNOSIS — Z1329 Encounter for screening for other suspected endocrine disorder: Secondary | ICD-10-CM

## 2013-03-25 DIAGNOSIS — Z87898 Personal history of other specified conditions: Secondary | ICD-10-CM

## 2013-03-25 DIAGNOSIS — F411 Generalized anxiety disorder: Secondary | ICD-10-CM

## 2013-03-25 DIAGNOSIS — I498 Other specified cardiac arrhythmias: Secondary | ICD-10-CM

## 2013-03-25 DIAGNOSIS — K589 Irritable bowel syndrome without diarrhea: Secondary | ICD-10-CM

## 2013-03-25 DIAGNOSIS — F329 Major depressive disorder, single episode, unspecified: Secondary | ICD-10-CM

## 2013-03-25 DIAGNOSIS — I10 Essential (primary) hypertension: Secondary | ICD-10-CM

## 2013-03-25 DIAGNOSIS — Z8679 Personal history of other diseases of the circulatory system: Secondary | ICD-10-CM

## 2013-03-25 DIAGNOSIS — F32A Depression, unspecified: Secondary | ICD-10-CM

## 2013-03-25 DIAGNOSIS — Z130 Encounter for screening for diseases of the blood and blood-forming organs and certain disorders involving the immune mechanism: Secondary | ICD-10-CM

## 2013-03-25 DIAGNOSIS — K219 Gastro-esophageal reflux disease without esophagitis: Secondary | ICD-10-CM

## 2013-03-25 DIAGNOSIS — J309 Allergic rhinitis, unspecified: Secondary | ICD-10-CM

## 2013-03-25 LAB — COMPREHENSIVE METABOLIC PANEL
ALT: 13 U/L (ref 0–35)
Albumin: 3.9 g/dL (ref 3.5–5.2)
BUN: 9 mg/dL (ref 6–23)
CO2: 31 mEq/L (ref 19–32)
Calcium: 9.5 mg/dL (ref 8.4–10.5)
Creat: 0.68 mg/dL (ref 0.50–1.10)
Glucose, Bld: 89 mg/dL (ref 70–99)
Potassium: 4.2 mEq/L (ref 3.5–5.3)
Total Bilirubin: 0.4 mg/dL (ref 0.3–1.2)
Total Protein: 6.7 g/dL (ref 6.0–8.3)

## 2013-03-25 LAB — CBC WITH DIFFERENTIAL/PLATELET
Basophils Absolute: 0.1 10*3/uL (ref 0.0–0.1)
Basophils Relative: 1 % (ref 0–1)
Hemoglobin: 13 g/dL (ref 12.0–15.0)
MCHC: 34.9 g/dL (ref 30.0–36.0)
Neutro Abs: 4.7 10*3/uL (ref 1.7–7.7)
Neutrophils Relative %: 56 % (ref 43–77)
RDW: 13.5 % (ref 11.5–15.5)

## 2013-03-25 LAB — TSH: TSH: 2.419 u[IU]/mL (ref 0.350–4.500)

## 2013-03-25 LAB — LIPID PANEL
LDL Cholesterol: 100 mg/dL — ABNORMAL HIGH (ref 0–99)
Triglycerides: 111 mg/dL (ref <150)
VLDL: 22 mg/dL (ref 0–40)

## 2013-03-25 MED ORDER — ALPRAZOLAM 0.5 MG PO TABS: 0.5000 mg | ORAL_TABLET | Freq: Two times a day (BID) | ORAL | Status: DC | PRN

## 2013-03-25 NOTE — Patient Instructions (Addendum)
Return in 6 months. Continue same medications. Prescribed 10 UA for weight loss

## 2013-05-15 ENCOUNTER — Other Ambulatory Visit: Payer: Self-pay | Admitting: Internal Medicine

## 2013-06-24 ENCOUNTER — Telehealth: Payer: Self-pay | Admitting: Internal Medicine

## 2013-06-24 NOTE — Telephone Encounter (Signed)
She will need OV to discuss management. I have some samples for now.

## 2013-06-24 NOTE — Telephone Encounter (Signed)
Appointment made for 4/2 @ 9:45.  Patient advised she can drop by for samples.

## 2013-07-09 ENCOUNTER — Other Ambulatory Visit: Payer: Self-pay

## 2013-07-09 ENCOUNTER — Other Ambulatory Visit: Payer: Self-pay | Admitting: Internal Medicine

## 2013-07-15 ENCOUNTER — Encounter: Payer: Self-pay | Admitting: Internal Medicine

## 2013-07-15 ENCOUNTER — Ambulatory Visit (INDEPENDENT_AMBULATORY_CARE_PROVIDER_SITE_OTHER): Payer: No Typology Code available for payment source | Admitting: Internal Medicine

## 2013-07-15 ENCOUNTER — Other Ambulatory Visit: Payer: Self-pay | Admitting: Internal Medicine

## 2013-07-15 VITALS — BP 120/64 | HR 60 | Temp 97.9°F | Wt 213.0 lb

## 2013-07-15 DIAGNOSIS — I10 Essential (primary) hypertension: Secondary | ICD-10-CM

## 2013-07-15 DIAGNOSIS — E669 Obesity, unspecified: Secondary | ICD-10-CM

## 2013-07-15 NOTE — Progress Notes (Signed)
   Subjective:    Patient ID: Andrea Thompson, female    DOB: 09/02/1965, 48 y.o.   MRN: [id number]  HPI Patient has a high deductible health insurance plan. Bystolic is $96 a month at present time on current plan. Wants to change to something cheaper. Blood pressure is excellent on Bystolic. It is too bad we have to change when she is so well controlled on this regimen and start something new with titration,etc. She has been exercising at [employer] and has lost 19 pounds. I am pleased with that. She's taking a mild appetite suppressant.    Review of Systems     Objective:   Physical Exam not examined. Spent 15 minutes speaking with patient about these issues. Blood pressure is excellent. She had physical exam in December.        Assessment & Plan:  Hypertension  I have given her samples of Bystolic 10 mg daily. If she cannot afford Bystolic we can switch to metoprolol and start with 25 mg daily. If Blood pressure is not well controlled, we can increase it to 50 mg daily. She continues with HCTZ. The other option would be an ACE inhibitor. Six-month recheck would normally be scheduled for June since she had physical exam in December 2014.

## 2013-07-15 NOTE — Patient Instructions (Signed)
Try metoprolol 25 mg daily and to check on blood pressure. May need increased to 50 mg daily for better control. Continue diuretic.

## 2013-09-12 ENCOUNTER — Encounter: Payer: Self-pay | Admitting: Internal Medicine

## 2013-09-12 NOTE — Progress Notes (Signed)
Subjective:    Patient ID: Andrea Thompson, female    DOB: [birth year], 48 y.o.   MRN: [id number]  HPI 48 year old white female in today for health maintenance and evaluation of medical issues. History of hypertension, irritable bowel syndrome, anxiety, migraine headaches, depression.  Past medical history: History of hysterectomy without oophorectomy 2002 done by Dr. Arlyce Dice for menorrhagia. Sees him for GYN care and has mammogram done through his office. History of GE reflux, allergic rhinitis, vitamin D deficiency. Quit smoking in 2008. In February 2012, she was found to have ventricular bigeminy during an evaluation in emergency department for evaluation of chest pain and palpitations. At that time she was on Bystolic for hypertension. She had a stress echocardiogram which was normal. She was reassured she was at low risk for coronary artery disease. She remains on Bystolic. Has tried Tenuate for  weight loss and has done well with that.  Social history: She is married. Has one son and 2 daughters. She is a Scientist, physiological for [employer]. She has a GED degree. Husband works for [employer]. Previously smoked a pack of cigarettes daily for some 18 years.  Family history: Mother has history of coronary artery disease and had angioplasty at age 36 and CABG at age 43. Father had MI at age 9. Grandfather with history of CABG. Father died of lung cancer Nov 22, 2002. Mother still living. One brother in good health.    Review of Systems  Constitutional: Positive for fatigue.  HENT: Negative.   Eyes: Negative.   Respiratory: Negative.   Cardiovascular: Negative.   Gastrointestinal: Negative.   Endocrine: Negative.   Genitourinary: Negative.   Neurological:       History of migraine headaches  Psychiatric/Behavioral:       Situational stress and anxiety       Objective:   Physical Exam  Vitals reviewed. Constitutional: She is oriented to person, place, and time. She appears  well-developed and well-nourished. No distress.  HENT:  Head: Normocephalic and atraumatic.  Right Ear: External ear normal.  Left Ear: External ear normal.  Mouth/Throat: Oropharynx is clear and moist. No oropharyngeal exudate.  Eyes: Conjunctivae and EOM are normal. Pupils are equal, round, and reactive to light. Right eye exhibits no discharge. Left eye exhibits no discharge. No scleral icterus.  Neck: Neck supple. No JVD present. No thyromegaly present.  Cardiovascular: Normal rate, normal heart sounds and intact distal pulses.   No murmur heard. Pulmonary/Chest: Effort normal and breath sounds normal. No respiratory distress. She has no wheezes. She has no rales. She exhibits no tenderness.  Abdominal: Soft. Bowel sounds are normal. She exhibits no distension and no mass. There is no tenderness. There is no rebound and no guarding.  Genitourinary:  Deferred to Dr. Arlyce Dice  Musculoskeletal: Normal range of motion. She exhibits no edema.  Lymphadenopathy:    She has no cervical adenopathy.  Neurological: She is alert and oriented to person, place, and time. She has normal reflexes. No cranial nerve deficit. Coordination normal.  Skin: Skin is warm and dry. No rash noted. She is not diaphoretic.  Psychiatric: She has a normal mood and affect. Her behavior is normal. Judgment and thought content normal.          Assessment & Plan:  Hypertension-stable with Bystolic and diuretic  Anxiety-stable with antianxiety medication  History of allergic rhinitis  History of migraine headaches  History of irritable bowel syndrome-treated sparingly with Xanax  GE reflux-treated with generic Protonix  History  of palpitations and ventricular bigeminy  History of depression treated with Zoloft  History of smoking quit 2008  Family history of coronary artery disease.  Obesity-refill Tenuate  Plan: Irritable bowel syndrome and anxiety treated with Xanax. Zoloft also helps anxiety and  depression symptoms. Samples of diastolic are given to her on a regular basis. Return in 6 months.

## 2013-09-30 ENCOUNTER — Encounter: Payer: Self-pay | Admitting: Internal Medicine

## 2013-09-30 ENCOUNTER — Ambulatory Visit (INDEPENDENT_AMBULATORY_CARE_PROVIDER_SITE_OTHER): Payer: No Typology Code available for payment source | Admitting: Internal Medicine

## 2013-09-30 VITALS — BP 126/78 | HR 60 | Wt 198.0 lb

## 2013-09-30 DIAGNOSIS — I10 Essential (primary) hypertension: Secondary | ICD-10-CM

## 2013-09-30 NOTE — Progress Notes (Signed)
   Subjective:    Patient ID: Andrea Thompson, female    DOB: 12/29/1965, 48 y.o.   MRN: [id number]  HPI  Patient recently started on Metoprolol 25 mg daily instead of Bystolic do to cost of Bystolic. She is also on HCTZ for hypertension. Over the past few months she has lost 34 pounds with the help of Tenuate. Recently has not felt well on metoprolol. Says blood pressures been running low. Had a bad migraine headache this past weekend.    Review of Systems     Objective:   Physical Exam  Not examined but 15 minutes speaking with patient about these issues. Blood pressure today is acceptable at 126/78. She did take 5 mg of Bystolic this morning that she had left over. Has been taking 5 mg of Bystolic at night. Would prefer she take blood pressure medication in the mornings.      Assessment & Plan:  Told her Bystolic samples would not be available any longer through this office. We may just try HCTZ 25 mg daily every morning and see if blood pressure is under good control. If not we may add back metoprolol 12.5 mg instead of 25 mg daily. She'll watch her blood pressure for a couple of weeks and let he know. She will continue weight loss efforts.

## 2013-09-30 NOTE — Patient Instructions (Signed)
Discontinue metoprolol and Bystolic. Just take HCTZ 25 mg daily and watch blood pressure for 2 weeks. Call if not well controlled. Can consider adding back metoprolol 12.5 mg instead of 25 mg daily.

## 2014-05-04 ENCOUNTER — Other Ambulatory Visit: Payer: Self-pay | Admitting: Internal Medicine

## 2014-05-10 ENCOUNTER — Other Ambulatory Visit: Payer: Self-pay | Admitting: Internal Medicine

## 2014-05-10 NOTE — Telephone Encounter (Signed)
Called in refills for Tenuate to patient pharmacy as requested

## 2014-05-10 NOTE — Telephone Encounter (Signed)
I thought we refilled this recently. Please check.It may need to be called in. It is Tenuate.

## 2014-06-03 ENCOUNTER — Ambulatory Visit (INDEPENDENT_AMBULATORY_CARE_PROVIDER_SITE_OTHER): Payer: BLUE CROSS/BLUE SHIELD | Admitting: Internal Medicine

## 2014-06-03 ENCOUNTER — Encounter: Payer: Self-pay | Admitting: Internal Medicine

## 2014-06-03 VITALS — BP 118/78 | HR 68 | Temp 98.0°F | Wt 180.0 lb

## 2014-06-03 DIAGNOSIS — Z130 Encounter for screening for diseases of the blood and blood-forming organs and certain disorders involving the immune mechanism: Secondary | ICD-10-CM

## 2014-06-03 DIAGNOSIS — I499 Cardiac arrhythmia, unspecified: Secondary | ICD-10-CM

## 2014-06-03 DIAGNOSIS — E669 Obesity, unspecified: Secondary | ICD-10-CM | POA: Diagnosis not present

## 2014-06-03 DIAGNOSIS — K589 Irritable bowel syndrome without diarrhea: Secondary | ICD-10-CM | POA: Diagnosis not present

## 2014-06-03 DIAGNOSIS — Z87891 Personal history of nicotine dependence: Secondary | ICD-10-CM

## 2014-06-03 DIAGNOSIS — K219 Gastro-esophageal reflux disease without esophagitis: Secondary | ICD-10-CM | POA: Diagnosis not present

## 2014-06-03 DIAGNOSIS — Z13220 Encounter for screening for lipoid disorders: Secondary | ICD-10-CM | POA: Diagnosis not present

## 2014-06-03 DIAGNOSIS — Z720 Tobacco use: Secondary | ICD-10-CM

## 2014-06-03 DIAGNOSIS — Z1321 Encounter for screening for nutritional disorder: Secondary | ICD-10-CM | POA: Diagnosis not present

## 2014-06-03 DIAGNOSIS — I10 Essential (primary) hypertension: Secondary | ICD-10-CM | POA: Diagnosis not present

## 2014-06-03 DIAGNOSIS — J309 Allergic rhinitis, unspecified: Secondary | ICD-10-CM | POA: Diagnosis not present

## 2014-06-03 DIAGNOSIS — Z8639 Personal history of other endocrine, nutritional and metabolic disease: Secondary | ICD-10-CM

## 2014-06-03 DIAGNOSIS — Z0000 Encounter for general adult medical examination without abnormal findings: Secondary | ICD-10-CM | POA: Diagnosis not present

## 2014-06-03 DIAGNOSIS — F419 Anxiety disorder, unspecified: Secondary | ICD-10-CM

## 2014-06-03 DIAGNOSIS — Z1329 Encounter for screening for other suspected endocrine disorder: Secondary | ICD-10-CM | POA: Diagnosis not present

## 2014-06-03 DIAGNOSIS — Z8669 Personal history of other diseases of the nervous system and sense organs: Secondary | ICD-10-CM | POA: Diagnosis not present

## 2014-06-03 DIAGNOSIS — F418 Other specified anxiety disorders: Secondary | ICD-10-CM | POA: Diagnosis not present

## 2014-06-03 DIAGNOSIS — F32A Depression, unspecified: Secondary | ICD-10-CM

## 2014-06-03 DIAGNOSIS — F329 Major depressive disorder, single episode, unspecified: Secondary | ICD-10-CM

## 2014-06-03 DIAGNOSIS — I498 Other specified cardiac arrhythmias: Secondary | ICD-10-CM

## 2014-06-03 LAB — COMPREHENSIVE METABOLIC PANEL
ALBUMIN: 4.3 g/dL (ref 3.5–5.2)
ALT: 12 U/L (ref 0–35)
AST: 11 U/L (ref 0–37)
Alkaline Phosphatase: 55 U/L (ref 39–117)
BUN: 9 mg/dL (ref 6–23)
CALCIUM: 9 mg/dL (ref 8.4–10.5)
CO2: 28 mEq/L (ref 19–32)
CREATININE: 0.58 mg/dL (ref 0.50–1.10)
Chloride: 102 mEq/L (ref 96–112)
GLUCOSE: 81 mg/dL (ref 70–99)
POTASSIUM: 3.8 meq/L (ref 3.5–5.3)
Sodium: 138 mEq/L (ref 135–145)
Total Bilirubin: 0.6 mg/dL (ref 0.2–1.2)
Total Protein: 6.7 g/dL (ref 6.0–8.3)

## 2014-06-03 LAB — LIPID PANEL
Cholesterol: 148 mg/dL (ref 0–200)
HDL: 43 mg/dL (ref >39)
LDL Cholesterol: 92 mg/dL (ref 0–99)
TRIGLYCERIDES: 65 mg/dL (ref <150)
Total CHOL/HDL Ratio: 3.4 Ratio
VLDL: 13 mg/dL (ref 0–40)

## 2014-06-03 LAB — CBC WITH DIFFERENTIAL/PLATELET
BASOS PCT: 1 % (ref 0–1)
Basophils Absolute: 0.1 10*3/uL (ref 0.0–0.1)
EOS ABS: 0.1 10*3/uL (ref 0.0–0.7)
Eosinophils Relative: 1 % (ref 0–5)
HCT: 38 % (ref 36.0–46.0)
Hemoglobin: 13 g/dL (ref 12.0–15.0)
Lymphocytes Relative: 36 % (ref 12–46)
Lymphs Abs: 2.1 10*3/uL (ref 0.7–4.0)
MCH: 30.4 pg (ref 26.0–34.0)
MCHC: 34.2 g/dL (ref 30.0–36.0)
MCV: 89 fL (ref 78.0–100.0)
MONOS PCT: 6 % (ref 3–12)
MPV: 9 fL (ref 8.6–12.4)
Monocytes Absolute: 0.4 10*3/uL (ref 0.1–1.0)
Neutro Abs: 3.3 10*3/uL (ref 1.7–7.7)
Neutrophils Relative %: 56 % (ref 43–77)
PLATELETS: 267 10*3/uL (ref 150–400)
RBC: 4.27 MIL/uL (ref 3.87–5.11)
RDW: 13.1 % (ref 11.5–15.5)
WBC: 5.9 10*3/uL (ref 4.0–10.5)

## 2014-06-03 LAB — POCT URINALYSIS DIPSTICK
Bilirubin, UA: NEGATIVE
Blood, UA: NEGATIVE
Glucose, UA: NEGATIVE
Ketones, UA: NEGATIVE
LEUKOCYTES UA: NEGATIVE
NITRITE UA: NEGATIVE
Protein, UA: NEGATIVE
Spec Grav, UA: <=1.005
UROBILINOGEN UA: NEGATIVE
pH, UA: 6

## 2014-06-03 NOTE — Patient Instructions (Addendum)
Very Pleased with 33 pound weight loss. Return in one year or as needed.

## 2014-06-03 NOTE — Progress Notes (Signed)
Subjective:    Patient ID: Andrea Thompson, female    DOB: 1965/10/30, 49 y.o.   MRN: [id number]  HPI 49 year old  White Female for health maintenance and evaluation of medical issues. Has lost 33 pounds since April. She is done this with diet and exercise as well as taking Tenuate which she says helps suppress her appetite. We have continued that with new prescription today. Her self-esteem has improved a great deal and she feels much better. She has a history of hypertension. Used to take by stolid for hypertension but because of cost she was switched to metoprolol. She is also on HCTZ. Blood pressure is excellent at 118/78. No new complaints or problems. She has a history of depression and irritable bowel syndrome. History of anxiety. History of ventricular bigeminy and palpitations. History of GE reflux. History of allergic rhinitis. History of migraine headaches.  Past medical history: Hysterectomy without oophorectomy 2002 done for menorrhagia. Mammogram done through GYN office. Quit smoking in 2008. In February 2012 was found to have ventricular bigeminy during evaluation in the emergency department of chest pain and palpitations. At that time she was on Bystolic for hypertension. She had a stress echocardiogram which was normal. She was reassured she was at low risk for coronary artery disease. Now on metoprolol.  Social history: She works for [employer]. She is married. She has a GED degree. 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 Previously smoked a pack of cigarettes daily for some 18 years. Has one son and 2 daughters.  Family history: Mother with history of coronary artery disease had angioplasty at age 64 and CABG at age 54. Mother had MI at age 44. Grandfather with history of CABG. Father died of lung cancer 10/24/2002. Mother living. One brother in good health.    Review of Systems  Constitutional: Negative.   Respiratory: Negative.   Gastrointestinal:       History of GE reflux  Allergic/Immunologic: Positive for environmental allergies.  Neurological:       History of migraine headaches  Psychiatric/Behavioral:       History of anxiety and depression       Objective:   Physical Exam  Constitutional: She is oriented to person, place, and time. She appears well-developed and well-nourished. No distress.  HENT:  Head: Normocephalic and atraumatic.  Right Ear: External ear normal.  Left Ear: External ear normal.  Mouth/Throat: Oropharynx is clear and moist. No oropharyngeal exudate.  Eyes: Conjunctivae and EOM are normal. Pupils are equal, round, and reactive to light. Right eye exhibits no discharge. Left eye exhibits no discharge. No scleral icterus.  Neck: Neck supple. No tracheal deviation present. No thyromegaly present.  Cardiovascular: Normal rate, regular rhythm, normal heart sounds and intact distal pulses.   No murmur heard. Pulmonary/Chest: Effort normal and breath sounds normal. No respiratory distress. She has no wheezes. She has no rales.  Breasts normal female  Abdominal: Soft. Bowel sounds are normal. She exhibits no distension and no mass. There is no tenderness. There is no rebound and no guarding.  Genitourinary:  Deferred to Dr. Arlyce Dice  Musculoskeletal: She exhibits no edema.  Lymphadenopathy:    She has no cervical adenopathy.  Neurological: She is alert and oriented to person, place, and time. She has normal reflexes. No cranial nerve deficit. Coordination normal.  Skin: Skin is warm and dry. She is not diaphoretic.  Psychiatric: She has a normal mood and affect. Her behavior is normal.  Judgment and thought content normal.  Vitals reviewed.         Assessment & Plan:  Essential hypertension-stable on current regimen. Recheck in 6 months  Obesity-33 pound weight loss with Tenuate diet and exercise- continue Tenuate  Anxiety depression-stable on SSRI. History of migraine headaches  Irritable bowel  syndrome  GE reflux  Plan: Continue same medications and return in 6 months.

## 2014-06-04 LAB — VITAMIN D 25 HYDROXY (VIT D DEFICIENCY, FRACTURES): VIT D 25 HYDROXY: 34 ng/mL (ref 30–100)

## 2014-06-04 LAB — TSH: TSH: 3.143 u[IU]/mL (ref 0.350–4.500)

## 2014-07-01 ENCOUNTER — Other Ambulatory Visit: Payer: Self-pay

## 2014-07-01 ENCOUNTER — Encounter: Payer: Self-pay | Admitting: Internal Medicine

## 2014-07-01 MED ORDER — HYDROCORTISONE ACE-PRAMOXINE 1-1 % RE FOAM: 1.0000 | Freq: Four times a day (QID) | RECTAL | Status: DC

## 2014-07-13 ENCOUNTER — Other Ambulatory Visit: Payer: Self-pay | Admitting: Internal Medicine

## 2014-07-15 NOTE — Telephone Encounter (Signed)
Left message for patient on cell voicemail  

## 2015-01-07 ENCOUNTER — Other Ambulatory Visit: Payer: Self-pay | Admitting: Internal Medicine

## 2015-01-11 ENCOUNTER — Other Ambulatory Visit: Payer: Self-pay | Admitting: *Deleted

## 2015-01-11 MED ORDER — SERTRALINE HCL 100 MG PO TABS: 100.0000 mg | ORAL_TABLET | Freq: Every day | ORAL | Status: DC

## 2015-01-11 MED ORDER — HYDROCHLOROTHIAZIDE 25 MG PO TABS: 25.0000 mg | ORAL_TABLET | Freq: Every day | ORAL | Status: DC

## 2015-01-11 MED ORDER — PANTOPRAZOLE SODIUM 40 MG PO TBEC: 40.0000 mg | DELAYED_RELEASE_TABLET | Freq: Every day | ORAL | Status: DC

## 2015-01-11 NOTE — Telephone Encounter (Signed)
Patient need office visit prior to anymore refills. 

## 2015-05-04 ENCOUNTER — Ambulatory Visit (INDEPENDENT_AMBULATORY_CARE_PROVIDER_SITE_OTHER): Payer: BLUE CROSS/BLUE SHIELD | Admitting: Internal Medicine

## 2015-05-04 ENCOUNTER — Encounter: Payer: Self-pay | Admitting: Internal Medicine

## 2015-05-04 VITALS — BP 152/96 | HR 63 | Temp 97.9°F | Resp 20 | Wt 195.5 lb

## 2015-05-04 DIAGNOSIS — E669 Obesity, unspecified: Secondary | ICD-10-CM | POA: Diagnosis not present

## 2015-05-04 DIAGNOSIS — R030 Elevated blood-pressure reading, without diagnosis of hypertension: Secondary | ICD-10-CM | POA: Diagnosis not present

## 2015-05-04 DIAGNOSIS — H6502 Acute serous otitis media, left ear: Secondary | ICD-10-CM

## 2015-05-04 DIAGNOSIS — IMO0001 Reserved for inherently not codable concepts without codable children: Secondary | ICD-10-CM

## 2015-05-04 DIAGNOSIS — R635 Abnormal weight gain: Secondary | ICD-10-CM

## 2015-05-04 DIAGNOSIS — J029 Acute pharyngitis, unspecified: Secondary | ICD-10-CM

## 2015-05-04 DIAGNOSIS — I10 Essential (primary) hypertension: Secondary | ICD-10-CM | POA: Diagnosis not present

## 2015-05-04 LAB — POCT RAPID STREP A (OFFICE): RAPID STREP A SCREEN: NEGATIVE

## 2015-05-04 MED ORDER — DIETHYLPROPION HCL ER 75 MG PO TB24: 75.0000 mg | ORAL_TABLET | Freq: Every day | ORAL | Status: DC

## 2015-05-04 MED ORDER — LEVOFLOXACIN 500 MG PO TABS: 500.0000 mg | ORAL_TABLET | Freq: Every day | ORAL | Status: DC

## 2015-05-04 NOTE — Progress Notes (Signed)
   Subjective:    Patient ID: Andrea Thompson, female    DOB: 12-10-1965, 50 y.o.   MRN: [id number]  HPI Patient went to the gym a couple of days ago. She began exercising on the treadmill and felt very fatigued and awful. She came home and rested. Subsequently developed sore scratchy throat and some pain and left neck. Slight cough. No fever or shaking chills. Had flu vaccine through employment. No myalgias. No headache. Although going to gym, she has gained 15 pounds since last year. Is not taking Tenuate at the present time. History of essential hypertension.    Review of Systems     Objective:   Physical Exam Skin warm and dry. Left TM slightly full. Right TM clear. Pharynx is red. Tonsils red without exudate. Neck is supple without adenopathy. Chest clear to auscultation without rales or wheezing. Cardiac exam regular rate and rhythm normal S1 and S2. Extremities without edema.       Assessment & Plan:  Acute left serous otitis media-see below  Tonsillitis-rapid strep screen negative. Treat with Levaquin 500 milligrams daily for 10 days  Obesity-restart Tenuate and continue exercising at jam  Hypertension-blood pressure elevated today. Has appointment for physical exam in March for follow-up.  Plan: Levaquin 500 milligrams daily for 10 days. Avoid decongestants with elevated blood pressure. Call if not better in 7-10 days or sooner if worse.

## 2015-05-04 NOTE — Patient Instructions (Addendum)
Levaquin 500 mg daily x 10 days. Avoid decongestants. Restart Tenuate. Follow-up with physical exam in March. Monitor blood pressure.

## 2015-05-19 ENCOUNTER — Other Ambulatory Visit: Payer: Self-pay | Admitting: Internal Medicine

## 2015-06-16 ENCOUNTER — Encounter: Payer: Self-pay | Admitting: Internal Medicine

## 2015-06-16 ENCOUNTER — Other Ambulatory Visit (HOSPITAL_COMMUNITY)
Admission: RE | Admit: 2015-06-16 | Discharge: 2015-06-16 | Disposition: A | Discharge status: Home / Self Care | Payer: BLUE CROSS/BLUE SHIELD | Source: Ambulatory Visit | Attending: Internal Medicine | Admitting: Internal Medicine

## 2015-06-16 ENCOUNTER — Ambulatory Visit (INDEPENDENT_AMBULATORY_CARE_PROVIDER_SITE_OTHER): Payer: BLUE CROSS/BLUE SHIELD | Admitting: Internal Medicine

## 2015-06-16 VITALS — BP 138/82 | HR 66 | Temp 98.4°F | Resp 20 | Ht 63.0 in | Wt 198.0 lb

## 2015-06-16 DIAGNOSIS — F411 Generalized anxiety disorder: Secondary | ICD-10-CM | POA: Diagnosis not present

## 2015-06-16 DIAGNOSIS — Z124 Encounter for screening for malignant neoplasm of cervix: Secondary | ICD-10-CM | POA: Diagnosis not present

## 2015-06-16 DIAGNOSIS — E669 Obesity, unspecified: Secondary | ICD-10-CM

## 2015-06-16 DIAGNOSIS — Z13220 Encounter for screening for lipoid disorders: Secondary | ICD-10-CM | POA: Diagnosis not present

## 2015-06-16 DIAGNOSIS — Z1321 Encounter for screening for nutritional disorder: Secondary | ICD-10-CM | POA: Diagnosis not present

## 2015-06-16 DIAGNOSIS — K219 Gastro-esophageal reflux disease without esophagitis: Secondary | ICD-10-CM

## 2015-06-16 DIAGNOSIS — Z87898 Personal history of other specified conditions: Secondary | ICD-10-CM

## 2015-06-16 DIAGNOSIS — M79644 Pain in right finger(s): Secondary | ICD-10-CM

## 2015-06-16 DIAGNOSIS — Z1329 Encounter for screening for other suspected endocrine disorder: Secondary | ICD-10-CM

## 2015-06-16 DIAGNOSIS — Z130 Encounter for screening for diseases of the blood and blood-forming organs and certain disorders involving the immune mechanism: Secondary | ICD-10-CM | POA: Diagnosis not present

## 2015-06-16 DIAGNOSIS — Z0000 Encounter for general adult medical examination without abnormal findings: Secondary | ICD-10-CM | POA: Diagnosis not present

## 2015-06-16 DIAGNOSIS — Z8679 Personal history of other diseases of the circulatory system: Secondary | ICD-10-CM | POA: Diagnosis not present

## 2015-06-16 DIAGNOSIS — F4541 Pain disorder exclusively related to psychological factors: Secondary | ICD-10-CM | POA: Diagnosis not present

## 2015-06-16 DIAGNOSIS — I10 Essential (primary) hypertension: Secondary | ICD-10-CM | POA: Diagnosis not present

## 2015-06-16 DIAGNOSIS — J309 Allergic rhinitis, unspecified: Secondary | ICD-10-CM | POA: Diagnosis not present

## 2015-06-16 DIAGNOSIS — Z01419 Encounter for gynecological examination (general) (routine) without abnormal findings: Secondary | ICD-10-CM | POA: Diagnosis present

## 2015-06-16 LAB — POCT URINALYSIS DIPSTICK
BILIRUBIN UA: NEGATIVE
GLUCOSE UA: NEGATIVE
KETONES UA: NEGATIVE
Leukocytes, UA: NEGATIVE
Nitrite, UA: NEGATIVE
Protein, UA: NEGATIVE
RBC UA: NEGATIVE
Spec Grav, UA: 1.025
Urobilinogen, UA: 0.2
pH, UA: 6

## 2015-06-16 LAB — LIPID PANEL
CHOL/HDL RATIO: 3.4 ratio (ref <=5.0)
Cholesterol: 155 mg/dL (ref 125–200)
HDL: 45 mg/dL — AB (ref >=46)
LDL CALC: 96 mg/dL (ref <130)
TRIGLYCERIDES: 68 mg/dL (ref <150)
VLDL: 14 mg/dL (ref <30)

## 2015-06-16 LAB — CBC WITH DIFFERENTIAL/PLATELET
Basophils Absolute: 0.1 10*3/uL (ref 0.0–0.1)
Basophils Relative: 1 % (ref 0–1)
Eosinophils Absolute: 0.1 10*3/uL (ref 0.0–0.7)
Eosinophils Relative: 2 % (ref 0–5)
HEMATOCRIT: 37.2 % (ref 36.0–46.0)
HEMOGLOBIN: 12.9 g/dL (ref 12.0–15.0)
LYMPHS PCT: 36 % (ref 12–46)
Lymphs Abs: 2.6 10*3/uL (ref 0.7–4.0)
MCH: 29.7 pg (ref 26.0–34.0)
MCHC: 34.7 g/dL (ref 30.0–36.0)
MCV: 85.7 fL (ref 78.0–100.0)
MONO ABS: 0.4 10*3/uL (ref 0.1–1.0)
MONOS PCT: 6 % (ref 3–12)
MPV: 9.1 fL (ref 8.6–12.4)
NEUTROS ABS: 4 10*3/uL (ref 1.7–7.7)
NEUTROS PCT: 55 % (ref 43–77)
Platelets: 251 10*3/uL (ref 150–400)
RBC: 4.34 MIL/uL (ref 3.87–5.11)
RDW: 12.8 % (ref 11.5–15.5)
WBC: 7.3 10*3/uL (ref 4.0–10.5)

## 2015-06-16 LAB — COMPLETE METABOLIC PANEL WITH GFR
ALBUMIN: 4.2 g/dL (ref 3.6–5.1)
ALK PHOS: 64 U/L (ref 33–115)
ALT: 16 U/L (ref 6–29)
AST: 13 U/L (ref 10–35)
BUN: 11 mg/dL (ref 7–25)
CALCIUM: 9.5 mg/dL (ref 8.6–10.2)
CO2: 30 mmol/L (ref 20–31)
Chloride: 102 mmol/L (ref 98–110)
Creat: 0.66 mg/dL (ref 0.50–1.10)
GFR, Est African American: >89 mL/min (ref >=60)
Glucose, Bld: 92 mg/dL (ref 65–99)
Potassium: 4.1 mmol/L (ref 3.5–5.3)
Sodium: 141 mmol/L (ref 135–146)
Total Bilirubin: 0.5 mg/dL (ref 0.2–1.2)
Total Protein: 6.7 g/dL (ref 6.1–8.1)

## 2015-06-16 LAB — TSH: TSH: 2.14 mIU/L

## 2015-06-16 NOTE — Progress Notes (Signed)
Subjective:    Patient ID: Andrea Thompson, female    DOB: 03/23/1966, 50 y.o.   MRN: [id number]  HPI 50 year old female in today for health maintenance exam. Physical exam February 2016 she had lost 33 pounds with continue weight and exercise. Unfortunately, she has gained 18 pounds since February 2016. Was 180 pounds then and now weighs 198 pounds. Hasn't been exercising. Under some situational stress at home taking care of 2 diabetics including her mother.  Working a lot of hours. History of ventricular bigeminy and palpitations previously treated with Bystolic. Since she lost the weight she was able to stop Bystolic. Blood pressure is elevated today. I think she should restart diastolic 5 mg daily. Samples given for 3 weeks. Blood pressures 138/82. Would like to see it under 130 systolically if possible.  Long-standing history of hypertension. At one point, Bystolic was a cost issue we switch to metoprolol. She also took HCTZ. She continues taking HCTZ but is off beta blocker entirely. Blood pressure used to be 118/78 in 2016.  History of depression, irritable bowel syndrome, anxiety. History of migraine headaches. History of allergic rhinitis. History of GE reflux.  Past medical history: Hysterectomy without oophorectomy 2002 done for menorrhagia.  Mammogram usually done through GYN office but hasn't seen GYN in 2 years. Stopped smoking in 2008. In February 2012 was found to have ventricular bigeminy during evaluation in the emergency department of chest pain and palpitations. At that time, she was on biased Aulik for hypertension. She had a stress echocardiogram which was normal. She was reassured that she was at low risk for coronary disease.  Social history: She is married. Has a GED degree. 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 Husband works for [employer]. Patient previously smoked a pack of cigarettes daily for some 18 years. Has one son and 2 daughters. Mother residing with  her.  Family history: Mother with history of coronary artery disease had angioplasty at age 50 and CABG at age 50. Subsequently had an MI at age 50. Grandfather with history of CABG. Father died of lung cancer July 2004. Mother living. One brother in good health.    Review of Systems fatigue, mildly depressed, overworked. Frustrated with her weight. Has developed pain and right thumb using female's frequently at work seems to be in first MCP joint. Recommend ice and ibuprofen and if no improvement in a couple of weeks referred to hand surgeon     Objective:   Physical Exam  Constitutional: She is oriented to person, place, and time. She appears well-developed and well-nourished. No distress.  HENT:  Head: Normocephalic and atraumatic.  Right Ear: External ear normal.  Left Ear: External ear normal.  Mouth/Throat: No oropharyngeal exudate.  Eyes: Conjunctivae and EOM are normal. Pupils are equal, round, and reactive to light. Right eye exhibits no discharge. Left eye exhibits no discharge. No scleral icterus.  Neck: Neck supple. No JVD present. No thyromegaly present.  Cardiovascular: Normal rate, regular rhythm, normal heart sounds and intact distal pulses.   No murmur heard. Pulmonary/Chest: Effort normal and breath sounds normal.  Abdominal: Soft. Bowel sounds are normal. She exhibits no distension and no mass. There is no tenderness. There is no rebound and no guarding.  Genitourinary:  Patient is status post hysterectomy. Bimanual normal. Pap taken of vaginal cuff.  Musculoskeletal: She exhibits no edema.  Neurological: She is alert and oriented to person, place, and time. She has normal reflexes. She displays normal reflexes. No cranial  nerve deficit. Coordination normal.  Skin: Skin is warm and dry. No rash noted. She is not diaphoretic.  Psychiatric: She has a normal mood and affect. Her behavior is normal. Judgment and thought content normal.  Vitals reviewed.           Assessment & Plan:  Obesity-has gained 18 pounds since February 2016  Essential hypertension-restart Bystolic  5 mg daily. If cannot afford Bystolic she will need to go back on metoprolol. Want  blood pressure in the 120/80 range. Currently on diuretic  Situational stress-currently on Zoloft  History of migraine headaches  GE reflux-treated with PPI  Allergic rhinitis  History of ventricular bigeminy  History of palpitations  History of anxiety  History of depression-currently on Zoloft  Plan: These to have screening mammogram in the near future. Order placed in Epic. Patient will need to make appointment to [facility] imaging if she does not want to have this done through GYN physician. Fasting labs and Pap smear result pending. Needs to diet exercise and lose weight and return in 6 months. She will keep an eye on her blood pressure and let me know how it is going of the next 2-3 weeks.

## 2015-06-16 NOTE — Patient Instructions (Addendum)
Keep blood pressure readings in the next week and call if persistently elevated. Restart Bystolic 5 mg daily. Diet exercise and weight loss recommended. Return in 6 months. Pap taken of vaginal cuff. Labs pending. Have mammogram. Order mailed and placed in EPIC.

## 2015-06-17 LAB — VITAMIN D 25 HYDROXY (VIT D DEFICIENCY, FRACTURES): Vit D, 25-Hydroxy: 38 ng/mL (ref 30–100)

## 2015-06-20 LAB — CYTOLOGY - PAP

## 2015-06-21 ENCOUNTER — Telehealth: Payer: Self-pay

## 2015-06-21 NOTE — Telephone Encounter (Signed)
Patient states that her blood pressure is doing well. She states that she has been to the gym every day this week.

## 2015-06-21 NOTE — Telephone Encounter (Signed)
Patient states that she started 5mg  Bystolic and is having terrible hot flashes. She wants to know if this is a side effect from the medication as she does not remember this from last time. Please advise

## 2015-06-21 NOTE — Telephone Encounter (Signed)
I have never heard of this with Bystolic. Is BP better?

## 2015-07-10 ENCOUNTER — Other Ambulatory Visit: Payer: Self-pay | Admitting: Internal Medicine

## 2015-08-21 DIAGNOSIS — M79644 Pain in right finger(s): Secondary | ICD-10-CM | POA: Diagnosis not present

## 2015-11-07 ENCOUNTER — Other Ambulatory Visit: Payer: Self-pay | Admitting: Internal Medicine

## 2015-11-07 MED ORDER — DIETHYLPROPION HCL ER 75 MG PO TB24: 1.0000 | ORAL_TABLET | Freq: Every day | ORAL | 2 refills | Status: DC

## 2015-11-07 NOTE — Addendum Note (Signed)
Addended by: Doree Barthel on: 11/07/2015 04:16 PM   Modules accepted: Orders

## 2015-11-30 DIAGNOSIS — M79644 Pain in right finger(s): Secondary | ICD-10-CM | POA: Diagnosis not present

## 2015-12-17 ENCOUNTER — Other Ambulatory Visit: Payer: Self-pay | Admitting: Internal Medicine

## 2015-12-21 ENCOUNTER — Encounter: Payer: Self-pay | Admitting: Internal Medicine

## 2015-12-21 ENCOUNTER — Ambulatory Visit (INDEPENDENT_AMBULATORY_CARE_PROVIDER_SITE_OTHER): Payer: BLUE CROSS/BLUE SHIELD | Admitting: Internal Medicine

## 2015-12-21 VITALS — BP 116/72 | HR 70 | Temp 98.6°F | Ht 63.0 in | Wt 209.0 lb

## 2015-12-21 DIAGNOSIS — E669 Obesity, unspecified: Secondary | ICD-10-CM

## 2015-12-21 DIAGNOSIS — I10 Essential (primary) hypertension: Secondary | ICD-10-CM

## 2015-12-21 DIAGNOSIS — R635 Abnormal weight gain: Secondary | ICD-10-CM

## 2015-12-21 NOTE — Patient Instructions (Signed)
Continue same medications and return in 6 months. Watch diet. Try to exercise once trigger thumb surgery is completed. Please have mammogram.

## 2015-12-21 NOTE — Progress Notes (Signed)
   Subjective:    Patient ID: Hilary HertzMarsha M Mcneel, female    DOB: 02/01/1966, 50 y.o.   MRN: [id number]  HPI 50 year old Female in today for six-month recheck of hypertension. Unfortunately she's gained 9 pounds since March when she had physical examination. Says she's not able to exercise. Apparently has right trigger thumb and Dr. Madelon Lipsaffrey plans to surgery on September 20. An injection did not help. Says she was told she developed this from overuse. 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 Says she's not able to grab onto the elliptical machine. Finds the treadmill boring. Has been stress eating. Her blood pressure is very acceptable. Bystolic 5 mg samples provided for her today for 1 month. When she went back on Bystolic she had less headaches and blood pressure normalized.  Lab work reviewed from physical examination in March. Her lipid panel was normal at that time as were kidney functions.    Review of Systems see above     Objective:   Physical Exam  Skin warm and dry. Nodes none. Neck is supple without JVD thyromegaly or carotid bruits. Chest clear to auscultation. Cardiac exam regular rate and rhythm normal S1 and S2.      Assessment & Plan:  Essential hypertension  Anxiety-related to trigger thumb  Obesity  Weight gain  Plan: What she's over her surgery for right trigger thumb, hopefully she can get back to her exercise regimen. Bystolic samples given today. Continue same medications. Watch diet. Return in 6 months for physical examination.  Reminded about annual mammogram.

## 2015-12-22 ENCOUNTER — Other Ambulatory Visit: Payer: Self-pay

## 2015-12-22 MED ORDER — SERTRALINE HCL 100 MG PO TABS: 100.0000 mg | ORAL_TABLET | Freq: Every day | ORAL | 11 refills | Status: DC

## 2016-01-01 ENCOUNTER — Other Ambulatory Visit: Payer: Self-pay | Admitting: *Deleted

## 2016-01-01 MED ORDER — PANTOPRAZOLE SODIUM 40 MG PO TBEC: 40.0000 mg | DELAYED_RELEASE_TABLET | Freq: Every day | ORAL | 11 refills | Status: DC

## 2016-01-01 MED ORDER — SERTRALINE HCL 100 MG PO TABS: 100.0000 mg | ORAL_TABLET | Freq: Every day | ORAL | 11 refills | Status: DC

## 2016-01-10 DIAGNOSIS — M65311 Trigger thumb, right thumb: Secondary | ICD-10-CM | POA: Diagnosis not present

## 2016-01-22 DIAGNOSIS — M65311 Trigger thumb, right thumb: Secondary | ICD-10-CM | POA: Diagnosis not present

## 2016-01-24 DIAGNOSIS — Z1231 Encounter for screening mammogram for malignant neoplasm of breast: Secondary | ICD-10-CM | POA: Diagnosis not present

## 2016-01-24 DIAGNOSIS — Z6841 Body Mass Index (BMI) 40.0 and over, adult: Secondary | ICD-10-CM | POA: Diagnosis not present

## 2016-01-24 DIAGNOSIS — Z01419 Encounter for gynecological examination (general) (routine) without abnormal findings: Secondary | ICD-10-CM | POA: Diagnosis not present

## 2016-03-14 ENCOUNTER — Telehealth: Payer: Self-pay | Admitting: Internal Medicine

## 2016-03-14 MED ORDER — NEBIVOLOL HCL 5 MG PO TABS: 5.0000 mg | ORAL_TABLET | Freq: Every day | ORAL | 2 refills | Status: DC

## 2016-03-14 NOTE — Telephone Encounter (Signed)
Bystolic 5mg  sent to pharmacy.  Patient aware.

## 2016-03-14 NOTE — Telephone Encounter (Signed)
Our records indicate she is on Bystolic 5 mg daily. Cannot call in 10 mg. Not allowed to split medications anymore. May call in #30 Bystolic 5 mg tablets 1 by mouth daily for hypertension with 2 refills

## 2016-03-14 NOTE — Telephone Encounter (Signed)
Patient is requesting that Bystolic 10mg  be called to her pharmacy.  States that her insurance is changing 12/1 and she needs to get this done today please.  And, she doesn't have a new card yet for the new insurance.  So, she's anxious to get it done today.    Pharmacy:  [facility]  P.S.  Looks like we have her on Bystolic 5mg  (maybe she was cutting them in half)?

## 2016-04-11 ENCOUNTER — Telehealth: Payer: Self-pay | Admitting: Internal Medicine

## 2016-04-11 NOTE — Telephone Encounter (Signed)
Left message for patient to call office to schedule appointment.  Patient needs to be seen to discuss bystolic.  This has been denied and patient needs to try other medications.

## 2016-04-18 ENCOUNTER — Ambulatory Visit (INDEPENDENT_AMBULATORY_CARE_PROVIDER_SITE_OTHER): Payer: BLUE CROSS/BLUE SHIELD | Admitting: Internal Medicine

## 2016-04-18 ENCOUNTER — Encounter: Payer: Self-pay | Admitting: Internal Medicine

## 2016-04-18 VITALS — BP 122/78 | HR 63 | Temp 97.3°F | Resp 16 | Wt 220.0 lb

## 2016-04-18 DIAGNOSIS — I10 Essential (primary) hypertension: Secondary | ICD-10-CM | POA: Diagnosis not present

## 2016-04-18 DIAGNOSIS — F411 Generalized anxiety disorder: Secondary | ICD-10-CM

## 2016-04-18 DIAGNOSIS — F439 Reaction to severe stress, unspecified: Secondary | ICD-10-CM | POA: Diagnosis not present

## 2016-04-18 MED ORDER — ALPRAZOLAM 0.25 MG PO TABS: 0.2500 mg | ORAL_TABLET | Freq: Two times a day (BID) | ORAL | 0 refills | Status: DC | PRN

## 2016-04-18 MED ORDER — METOPROLOL SUCCINATE ER 25 MG PO TB24: 25.0000 mg | ORAL_TABLET | Freq: Every day | ORAL | 1 refills | Status: DC

## 2016-04-18 NOTE — Progress Notes (Signed)
   Subjective:    Patient ID: Andrea Thompson, female    DOB: 04/07/1966, 51 y.o.   MRN: [id number]  HPI  51 year old Female for follow up on HTN. Insurance will not pay for Bystolic which has controlled BP well.Unfortunately, her mother had a stroke recently after an angioplasty. She has some left-sided weakness and deficits. She is living with patient. Patient has a lot of stress with her mother living with her. Daughter takes care of her during the day and patient has to take care of her at night and continue to work full-time.  Amazingly her blood pressures under good control today. Unfortunately we have to change medications because of her insurance issues.    Review of Systems see above     Objective:   Physical Exam  Skin warm and dry. Nodes none. Neck is supple without JVD thyromegaly or carotid bruits. Chest clear to auscultation. Cardiac exam regular rate and rhythm normal S1 and S2 without murmurs or gallops. Extremities without edema. Patient is anxious and slightly tearful in the office today.      Assessment & Plan:  HTN  Anxiety  Situational stress  Obesity-has not been exercising  Plan: Xanax 0.25 mg #90 no refill 1 by mouth 3 times a day when necessary anxiety. Change Bystolic to metoprolol 25 mg once daily and return in 3.5 weeks. Continue HCTZ. Continue Zoloft.

## 2016-04-18 NOTE — Patient Instructions (Signed)
Discontinue Bystolic. Metoprolol 25 mg daily. Follow-up in 3 weeks. Xanax 0.25 milligrams as needed for anxiety up to 3 times a day. Continue HCTZ and Zoloft.

## 2016-05-14 ENCOUNTER — Encounter: Payer: Self-pay | Admitting: Internal Medicine

## 2016-05-14 ENCOUNTER — Ambulatory Visit (INDEPENDENT_AMBULATORY_CARE_PROVIDER_SITE_OTHER): Payer: BLUE CROSS/BLUE SHIELD | Admitting: Internal Medicine

## 2016-05-14 VITALS — BP 110/70 | HR 57 | Wt 223.0 lb

## 2016-05-14 DIAGNOSIS — F411 Generalized anxiety disorder: Secondary | ICD-10-CM | POA: Diagnosis not present

## 2016-05-14 DIAGNOSIS — I10 Essential (primary) hypertension: Secondary | ICD-10-CM | POA: Diagnosis not present

## 2016-05-14 DIAGNOSIS — F439 Reaction to severe stress, unspecified: Secondary | ICD-10-CM | POA: Diagnosis not present

## 2016-05-14 MED ORDER — DIETHYLPROPION HCL ER 75 MG PO TB24: 1.0000 | ORAL_TABLET | Freq: Every day | ORAL | 2 refills | Status: DC

## 2016-05-14 NOTE — Progress Notes (Signed)
   Subjective:    Patient ID: Andrea Thompson, female    DOB: 06/21/1965, 51 y.o.   MRN: [id number]  HPI Says she quit taking metoprolol because it was not controlling BP. Still taking HCTZ. Stopped metoprolol and went back to Bystolic 5 mg samples she had on hand, says insurance will not pay for it.  BP readings on metoprolol 149, 144, 148, 144, 140 systolic with normal diastolic readings. Also complained of edema and headache on metoprolol.  Review of Systems     Objective:   Physical Exam  No thyromegaly. Chest clear to Auscultation without rales or wheezing. Cardiac exam regular rate and rhythm. No pitting lower extremity edema      Assessment & Plan:  Essential HTN  Obesity  Situational stress-mother had stroke and is doing better  Plan: Refill Tenuate at her request. Contemplated trying her on losartan 100 mg daily in addition to HCTZ, however she wants to take a discount card and see if she can get a price on Bystolic that she can afford. She feels better on Bystolic and her blood pressures been well controlled on that regimen. She'll let me know what she plans to in the near future.

## 2016-05-14 NOTE — Patient Instructions (Signed)
Patient to see if she can afford Bystolic using a discount coupon. She'll let me know what she plans today. Continue HCTZ. Monitor blood pressure at home. Refill Tenuate.

## 2016-05-21 ENCOUNTER — Telehealth: Payer: Self-pay | Admitting: Internal Medicine

## 2016-05-21 MED ORDER — LOSARTAN POTASSIUM 100 MG PO TABS: 100.0000 mg | ORAL_TABLET | Freq: Every day | ORAL | 0 refills | Status: DC

## 2016-05-21 NOTE — Telephone Encounter (Signed)
Sent, unable to leave message.

## 2016-05-21 NOTE — Telephone Encounter (Signed)
E-scribe losartan 100 mg daily (#30 tablets) for her and have her continue HCTZ also. See in 2-3 weeks here for follow up

## 2016-05-21 NOTE — Telephone Encounter (Signed)
Bystolic Rx with prescription card - she still couldn't afford to get it filled.  She advised to please just call something in for her to her pharmacy.  Asked her if she has called her pharmacy to find out what is covered by her pharmacy.  She laughed and said that it's flu season and they're slammed and booked until 7pm.  I said her insurance probably will take calls after 7pm for customer service if she wants to call them to find out what they will pay for and what they won't pay for.    She said they used to pay for what you used to prescribe for her, it just gave her headaches.  She said you were going to have her double up on it.  She said, just give her whatever you want to give her and she'll go to the pharmacy and pick it up.    I tried to get the patient to find out what would be covered and put a little ownership on the patient, but she wasn't having it.    [facility] [address]  Best Number for contact:  [phone], [phone]. [phone]

## 2016-06-03 ENCOUNTER — Other Ambulatory Visit: Payer: Self-pay | Admitting: Internal Medicine

## 2016-06-18 ENCOUNTER — Other Ambulatory Visit: Payer: Self-pay | Admitting: Internal Medicine

## 2016-06-21 ENCOUNTER — Other Ambulatory Visit: Payer: BLUE CROSS/BLUE SHIELD | Admitting: Internal Medicine

## 2016-06-21 ENCOUNTER — Ambulatory Visit (INDEPENDENT_AMBULATORY_CARE_PROVIDER_SITE_OTHER): Payer: BLUE CROSS/BLUE SHIELD | Admitting: Internal Medicine

## 2016-06-21 ENCOUNTER — Encounter: Payer: Self-pay | Admitting: Internal Medicine

## 2016-06-21 VITALS — BP 138/88 | HR 80 | Temp 98.2°F | Ht 61.0 in | Wt 218.3 lb

## 2016-06-21 DIAGNOSIS — Z0000 Encounter for general adult medical examination without abnormal findings: Secondary | ICD-10-CM

## 2016-06-21 DIAGNOSIS — Z13220 Encounter for screening for lipoid disorders: Secondary | ICD-10-CM

## 2016-06-21 DIAGNOSIS — F411 Generalized anxiety disorder: Secondary | ICD-10-CM | POA: Diagnosis not present

## 2016-06-21 DIAGNOSIS — M19042 Primary osteoarthritis, left hand: Secondary | ICD-10-CM

## 2016-06-21 DIAGNOSIS — F439 Reaction to severe stress, unspecified: Secondary | ICD-10-CM

## 2016-06-21 DIAGNOSIS — Z1321 Encounter for screening for nutritional disorder: Secondary | ICD-10-CM | POA: Diagnosis not present

## 2016-06-21 DIAGNOSIS — Z6841 Body Mass Index (BMI) 40.0 and over, adult: Secondary | ICD-10-CM | POA: Diagnosis not present

## 2016-06-21 DIAGNOSIS — I10 Essential (primary) hypertension: Secondary | ICD-10-CM | POA: Diagnosis not present

## 2016-06-21 DIAGNOSIS — F4541 Pain disorder exclusively related to psychological factors: Secondary | ICD-10-CM

## 2016-06-21 DIAGNOSIS — M19041 Primary osteoarthritis, right hand: Secondary | ICD-10-CM

## 2016-06-21 DIAGNOSIS — Z1329 Encounter for screening for other suspected endocrine disorder: Secondary | ICD-10-CM

## 2016-06-21 LAB — COMPREHENSIVE METABOLIC PANEL
ALT: 14 U/L (ref 6–29)
AST: 11 U/L (ref 10–35)
Albumin: 4.3 g/dL (ref 3.6–5.1)
Alkaline Phosphatase: 72 U/L (ref 33–130)
BUN: 13 mg/dL (ref 7–25)
CHLORIDE: 106 mmol/L (ref 98–110)
CO2: 28 mmol/L (ref 20–31)
CREATININE: 0.74 mg/dL (ref 0.50–1.05)
Calcium: 9.2 mg/dL (ref 8.6–10.4)
Glucose, Bld: 88 mg/dL (ref 65–99)
Potassium: 4.3 mmol/L (ref 3.5–5.3)
SODIUM: 142 mmol/L (ref 135–146)
Total Bilirubin: 0.3 mg/dL (ref 0.2–1.2)
Total Protein: 6.9 g/dL (ref 6.1–8.1)

## 2016-06-21 LAB — POCT URINALYSIS DIPSTICK
Bilirubin, UA: NEGATIVE
GLUCOSE UA: NEGATIVE
Ketones, UA: NEGATIVE
Leukocytes, UA: NEGATIVE
Nitrite, UA: NEGATIVE
Protein, UA: NEGATIVE
RBC UA: NEGATIVE
SPEC GRAV UA: 1.025
UROBILINOGEN UA: NEGATIVE
pH, UA: 6

## 2016-06-21 LAB — LIPID PANEL
Cholesterol: 151 mg/dL (ref <200)
HDL: 39 mg/dL — ABNORMAL LOW (ref >50)
LDL CALC: 98 mg/dL (ref <100)
Total CHOL/HDL Ratio: 3.9 Ratio (ref <5.0)
Triglycerides: 70 mg/dL (ref <150)
VLDL: 14 mg/dL (ref <30)

## 2016-06-21 LAB — CBC WITH DIFFERENTIAL/PLATELET
BASOS ABS: 62 {cells}/uL (ref 0–200)
Basophils Relative: 1 %
EOS PCT: 2 %
Eosinophils Absolute: 124 cells/uL (ref 15–500)
HCT: 38.1 % (ref 35.0–45.0)
Hemoglobin: 12.9 g/dL (ref 11.7–15.5)
Lymphocytes Relative: 35 %
Lymphs Abs: 2170 cells/uL (ref 850–3900)
MCH: 29.3 pg (ref 27.0–33.0)
MCHC: 33.9 g/dL (ref 32.0–36.0)
MCV: 86.6 fL (ref 80.0–100.0)
MONOS PCT: 7 %
MPV: 9.1 fL (ref 7.5–12.5)
Monocytes Absolute: 434 cells/uL (ref 200–950)
NEUTROS ABS: 3410 {cells}/uL (ref 1500–7800)
NEUTROS PCT: 55 %
PLATELETS: 266 10*3/uL (ref 140–400)
RBC: 4.4 MIL/uL (ref 3.80–5.10)
RDW: 13.4 % (ref 11.0–15.0)
WBC: 6.2 10*3/uL (ref 3.8–10.8)

## 2016-06-21 LAB — TSH: TSH: 3.08 m[IU]/L

## 2016-06-21 MED ORDER — AMLODIPINE BESYLATE 5 MG PO TABS: 5.0000 mg | ORAL_TABLET | Freq: Every day | ORAL | 3 refills | Status: DC

## 2016-06-21 MED ORDER — LOSARTAN POTASSIUM-HCTZ 100-25 MG PO TABS: 1.0000 | ORAL_TABLET | Freq: Every day | ORAL | 3 refills | Status: DC

## 2016-06-21 NOTE — Patient Instructions (Signed)
Try to work on diet and exercise. Add Norvasc 5 mg daily to losartan HCTZ 100/25 and return in 4 weeks. Labs are pending. May take Advil or Aleve for arthritis of hands

## 2016-06-21 NOTE — Progress Notes (Signed)
Subjective:    Patient ID: Andrea HertzMarsha M Cunningham, female    DOB: 06/27/1965, 51 y.o.   MRN: [id number]  HPI  51 year old White Female for health maintenance exam and evaluation of medical issues.  Home BP readings 128-154  systolically and 65-84 diastolically.  Tolerating BP med OK. Was changed because she could not afford Bystolic.  Blood pressure today is 138/88.  In February 2016 she had lost 33 pounds with diet and exercise. Unfortunately she has regained her weight. Weight in late January was 223 pounds. Weight in March 2017 was 198 pounds.  Situational stress continues at home. Her mother lives with her and requires a lot of attention. Patient not getting a lot of sleep. Continues to work a full-time job.  History of ventricular bigeminy and palpitations which were well controlled on Bystolic.  When she lost the weight, she was able to stop Bystolic.  Long-standing history of hypertension. Has also taken HCTZ for some time.  History of depression, irritable bowel syndrome, anxiety. History of migraine headaches. History of allergic rhinitis. History of GE reflux.  Past medical history: Hysterectomy without oophorectomy 2002 done for menorrhagia. Stopped smoking in 2008.  In February 2012 was found to have ventricular bigeminy during evaluation in the emergency department of chest pain and palpitations. She had a stress echocardiogram which was normal. She was reassured that she was at low risk for coronary disease.  Social history: She is married. She has a GED degree. 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 Patient previously smoked a pack of cigarettes daily for some 18 years. She has one son and 2 daughters.  Family history: Mother with history of coronary artery disease and angioplasty at age 449 and CABG at age 51. Subsequently had MI at age 51. Grandfather with history of CABG. Father died of lung cancer July 2004.  One brother in good health.  Currently on losartan HCTZ 100/25.   Review of Systems fatigued. Depressed. Frustrated with her weight. Has pain and right thumb M CP joint likely related to arthritis.     Objective:   Physical Exam  Constitutional: She is oriented to person, place, and time. She appears well-developed and well-nourished. No distress.  HENT:  Head: Normocephalic and atraumatic.  Right Ear: External ear normal.  Left Ear: External ear normal.  Mouth/Throat: Oropharynx is clear and moist.  Eyes: Conjunctivae and EOM are normal. Pupils are equal, round, and reactive to light. Right eye exhibits no discharge. Left eye exhibits no discharge. No scleral icterus.  Neck: Normal range of motion. Neck supple. No JVD present. No thyromegaly present.  Cardiovascular: Normal rate, regular rhythm, normal heart sounds and intact distal pulses.   No murmur heard. Pulmonary/Chest: Effort normal and breath sounds normal.  Breasts normal female without masses  Abdominal: Soft. Bowel sounds are normal. She exhibits no distension and no mass. There is no tenderness. There is no rebound and no guarding.  Genitourinary:  Genitourinary Comments: Pap taken of vaginal cuff 2017. Bimanual normal.  Musculoskeletal: She exhibits no edema.  Heberden's and Bouchard's nodes both hands  Lymphadenopathy:    She has no cervical adenopathy.  Neurological: She is alert and oriented to person, place, and time. She has normal reflexes. No cranial nerve deficit. Coordination normal.  Skin: Skin is warm and dry. No rash noted.  Psychiatric: She has a normal mood and affect. Her behavior is normal. Judgment and thought content normal.  Vitals reviewed.         Assessment & Plan:  Essential hypertension-control needs to be improved add Norvasc 5 mg daily to losartan HCTZ 100/25 and return in 4 weeks.  Hand arthritis- bilateral. May take Advil or Aleve.  Situational stress  Anxiety  depression  Obesity  Plan: Continue to work on diet exercise and weight loss. Return in 4 weeks for evaluation of hypertension having added Norvasc 5 mg daily to current regimen.  Needs colonoscopy in the near future.

## 2016-06-22 LAB — VITAMIN D 25 HYDROXY (VIT D DEFICIENCY, FRACTURES): VIT D 25 HYDROXY: 30 ng/mL (ref 30–100)

## 2016-07-18 ENCOUNTER — Ambulatory Visit (INDEPENDENT_AMBULATORY_CARE_PROVIDER_SITE_OTHER): Payer: BLUE CROSS/BLUE SHIELD | Admitting: Internal Medicine

## 2016-07-18 VITALS — BP 142/78 | HR 77 | Temp 97.9°F | Ht 61.0 in | Wt 225.0 lb

## 2016-07-18 DIAGNOSIS — I10 Essential (primary) hypertension: Secondary | ICD-10-CM | POA: Diagnosis not present

## 2016-07-18 DIAGNOSIS — F439 Reaction to severe stress, unspecified: Secondary | ICD-10-CM

## 2016-07-18 DIAGNOSIS — F411 Generalized anxiety disorder: Secondary | ICD-10-CM

## 2016-08-10 ENCOUNTER — Encounter: Payer: Self-pay | Admitting: Internal Medicine

## 2016-08-10 NOTE — Progress Notes (Signed)
   Subjective:    Patient ID: Andrea Thompson, female    DOB: [birth year], 51 y.o.   MRN: [id number]  HPI 51 year old Female with anxiety and essential hypertension was seen on March 9 for physical examination. She cannot afford Bystolic. We added Norvasc to losartan HCTZ 100/25 at last visit and she is here today for follow-up.  Her mother is not doing well. She lives with the patient and his been very stressful. Patient not getting a lot of rest or sleep. Is considering placing mother in a nursing home and wants to know how to go about doing that. She will need to have you felt to form completed by patient's physician. She needs to check around the area and see which facilities would suit her mother and her. Spent some 20-25 minutes speaking with patient about all of these issues. Continues to be overweight and doesn't have time to exercise.  Blood pressure remains elevated today at 142/78. However she's not had much sleep last night due to her mother being up. She has Xanax on hand for anxiety.   Review of Systems     Objective:   Physical Exam Spent 25 minutes speaking with her about these issues. She is on Zoloft 100 mg daily and has Xanax 0.25 mg daily on hand for anxiety up to twice daily.       Assessment & Plan:  Essential hypertension  Situational stress  Anxiety disorder  Depression  Plan: Continue when necessary Xanax and continue daily Zoloft. I have not changed her blood pressure regimen at this point in time. I think her blood pressure will improve if she makes placement of her mother in a nursing facility.  I would like for patient to call me was some blood pressure readings in 2-4 weeks. Physical exam was [phone]

## 2016-08-10 NOTE — Patient Instructions (Signed)
Monitor blood pressure at home. Next visit will be for physical exam. Please watch blood pressure and call with readings in 2-4 weeks.

## 2016-11-07 ENCOUNTER — Telehealth: Payer: Self-pay

## 2016-11-07 NOTE — Telephone Encounter (Signed)
Pt called and stated that she has pain under her breast that has been lasting this entire week. She has no fever and is experiencing loose stools. Has been eating a lot of cucumbers and tomatoes. No history of diverticulitis. Advised that she go on a clear liquid diet for possible gastroenteritis and call us back if worsening

## 2016-12-09 ENCOUNTER — Other Ambulatory Visit: Payer: Self-pay | Admitting: Internal Medicine

## 2016-12-09 NOTE — Telephone Encounter (Signed)
Called pt but VM was full and unable to LVM

## 2016-12-09 NOTE — Telephone Encounter (Signed)
Patient returned the call; advised that she has to make a 6 mo visit in order to fill this medication.  Appointment made for 9/18 @ 9:45.  She needs a morning appointment and not on Monday or Friday -- so this appointment got pushed out a little far into September.    [facility]?    Thank you.

## 2016-12-09 NOTE — Telephone Encounter (Signed)
Please call pt. Due for 6 month recheck in Sept. May refill for 30 days if appt booked. Must call this in.

## 2016-12-31 ENCOUNTER — Encounter: Payer: Self-pay | Admitting: Internal Medicine

## 2016-12-31 ENCOUNTER — Ambulatory Visit (INDEPENDENT_AMBULATORY_CARE_PROVIDER_SITE_OTHER): Payer: BLUE CROSS/BLUE SHIELD | Admitting: Internal Medicine

## 2016-12-31 VITALS — BP 138/84 | HR 74 | Temp 97.8°F | Wt 233.0 lb

## 2016-12-31 DIAGNOSIS — I10 Essential (primary) hypertension: Secondary | ICD-10-CM | POA: Diagnosis not present

## 2016-12-31 DIAGNOSIS — F439 Reaction to severe stress, unspecified: Secondary | ICD-10-CM | POA: Diagnosis not present

## 2016-12-31 DIAGNOSIS — Z6841 Body Mass Index (BMI) 40.0 and over, adult: Secondary | ICD-10-CM | POA: Diagnosis not present

## 2016-12-31 MED ORDER — DIETHYLPROPION HCL ER 75 MG PO TB24: 1.0000 | ORAL_TABLET | Freq: Every day | ORAL | 0 refills | Status: DC

## 2016-12-31 MED ORDER — HYDROCHLOROTHIAZIDE 25 MG PO TABS: 25.0000 mg | ORAL_TABLET | Freq: Every day | ORAL | 3 refills | Status: DC

## 2016-12-31 NOTE — Progress Notes (Signed)
   Subjective:    Patient ID: [name], female    DOB: [birth year], 51 y.o.   MRN: [id number]  HPI 51 year old Female in today for follow-up on hypertension and obesity. Unfortunately has gained 15 pounds since March. Is out of Tenuate. Mother is in nursing home and patient continues to have considerable stress with that situation. Sometimes goes to the gym but not as frequently as she use to. Is overeating.  Blood pressure has been elevated up to 150 systolically. She brings in several readings. One reading was 144/65 and the other reading was 152/73.  She felt better on Bystolic but her insurance would not pay for it. I have given her a coupon for Bystolic and samples today. We're going to discontinue losartan HCTZ. She'll take HCTZ 25 mg daily, Bystolic 10 mg daily and Norvasc 5 mg daily. She'll return next week for office visit blood pressure check and basic metabolic panel on September 28.    Review of Systems see above-looks fatigued and expresses fatigue     Objective:   Physical Exam Neck is supple without JVD thyromegaly or carotid bruits. Chest clear to auscultation. Cardiac exam regular rate and rhythm normal S1 and S2. Extremities without edema       Assessment & Plan:  Essential hypertension  Situational stress  Obesity  Plan: Bystolic 10 mg daily. HCTZ 25 mg daily. Norvasc 5 mg daily and return next week for office visit basic metabolic panel and blood pressure check. Refill Tenuate.

## 2016-12-31 NOTE — Patient Instructions (Signed)
Bystolic 10 mg daily, HCTZ 25 mg daily Norvasc 5 mg daily and follow-up next week. Tenuate refilled.

## 2017-01-05 ENCOUNTER — Other Ambulatory Visit: Payer: Self-pay | Admitting: Internal Medicine

## 2017-01-10 ENCOUNTER — Encounter: Payer: Self-pay | Admitting: Internal Medicine

## 2017-01-10 ENCOUNTER — Ambulatory Visit (INDEPENDENT_AMBULATORY_CARE_PROVIDER_SITE_OTHER): Payer: BLUE CROSS/BLUE SHIELD | Admitting: Internal Medicine

## 2017-01-10 VITALS — BP 108/60 | HR 70 | Temp 97.5°F

## 2017-01-10 DIAGNOSIS — M79672 Pain in left foot: Secondary | ICD-10-CM

## 2017-01-10 DIAGNOSIS — I10 Essential (primary) hypertension: Secondary | ICD-10-CM | POA: Diagnosis not present

## 2017-01-10 DIAGNOSIS — R609 Edema, unspecified: Secondary | ICD-10-CM

## 2017-01-10 MED ORDER — FUROSEMIDE 20 MG PO TABS: 20.0000 mg | ORAL_TABLET | Freq: Every day | ORAL | 3 refills | Status: DC

## 2017-01-10 NOTE — Patient Instructions (Signed)
Change HCTZ to Lasix 20 mg daily. Continue Bystolic 10 mg daily. Discontinue losartan HCTZ. Follow-up in 3 weeks at which time she'll need office visit blood pressure check and basic metabolic panel.

## 2017-01-10 NOTE — Progress Notes (Signed)
   Subjective:    Patient ID: Andrea Thompson, female    DOB: February 04, 1966, 51 y.o.   MRN: [id number]  HPI  She is here today to follow-up on lower extremity edema and hypertension. At last visit was told to discontinue losartan HCTZ and start on Bystolic 10 mg daily plus HCTZ She continues on Tenuate for weight loss. Believe she misunderstood and continued to take losartan HCTZ. Blood pressure is low at 108/60.   Review of Systems complaining of left foot pain which is been present for some time. As chronic left foot swelling     Objective:   Physical Exam  HENT:  Head: Normocephalic and atraumatic.  Cardiovascular: Normal rate, regular rhythm, normal heart sounds and intact distal pulses.   No murmur heard. Pulmonary/Chest: Effort normal and breath sounds normal. No respiratory distress. She has no wheezes.  Musculoskeletal:  Edema of left foot more noticeable than right foot.  Vitals reviewed.         Assessment & Plan:  Pain in left foot with edema-declines x-ray Probable dependent edema-usually wears compression stocking at work Hypertension improved with Bystolic-Feels better on Bystolic  Plan: I want her to DC losartan/ HCTZ and take Bystolic 10 mg daily with Lasix 20 mg daily. She will return in 3 weeks. Basic metabolic panel will be drawn then.

## 2017-01-15 ENCOUNTER — Ambulatory Visit
Admission: RE | Admit: 2017-01-15 | Discharge: 2017-01-15 | Disposition: A | Discharge status: Home / Self Care | Payer: BLUE CROSS/BLUE SHIELD | Source: Ambulatory Visit | Attending: Internal Medicine | Admitting: Internal Medicine

## 2017-01-15 ENCOUNTER — Telehealth: Payer: Self-pay | Admitting: Internal Medicine

## 2017-01-15 DIAGNOSIS — R0989 Other specified symptoms and signs involving the circulatory and respiratory systems: Secondary | ICD-10-CM | POA: Diagnosis not present

## 2017-01-15 DIAGNOSIS — R609 Edema, unspecified: Secondary | ICD-10-CM

## 2017-01-15 NOTE — Telephone Encounter (Signed)
Pt is aware and she states she took her old fluid medication that she was taken off of today so she will take  in the morning. She said she will go for the chest x ray today and appt made for tomorrow

## 2017-01-15 NOTE — Telephone Encounter (Signed)
She said the Lasix are causing her to swell.  Says that she's swelling so much that it hurts to walk.  She didn't take it today.  She is certain that she stopped the Losartan/HCTZ and is taking the Bystolic and Lasix and you prescribed.  However, states that she did not take the Lasix today because she is too swollen.  Says that when she went to bed last night the swelling had not gone down at all this morning.    Best # for contact:  [phone]  Ext [phone] (her work)

## 2017-01-15 NOTE — Telephone Encounter (Signed)
She needs to take Lasix 40 mg today and tomorrow. Return here tomorrow. Needs CXR to r/o heart failure today.

## 2017-01-16 ENCOUNTER — Encounter: Payer: Self-pay | Admitting: Internal Medicine

## 2017-01-16 ENCOUNTER — Emergency Department (HOSPITAL_COMMUNITY): Payer: BLUE CROSS/BLUE SHIELD

## 2017-01-16 ENCOUNTER — Other Ambulatory Visit: Payer: Self-pay | Admitting: Internal Medicine

## 2017-01-16 ENCOUNTER — Encounter (HOSPITAL_COMMUNITY): Payer: Self-pay | Admitting: Emergency Medicine

## 2017-01-16 ENCOUNTER — Observation Stay (HOSPITAL_COMMUNITY)
Admission: EM | Admit: 2017-01-16 | Discharge: 2017-01-17 | Disposition: A | Discharge status: Home / Self Care | Payer: BLUE CROSS/BLUE SHIELD | Attending: Internal Medicine | Admitting: Internal Medicine

## 2017-01-16 ENCOUNTER — Ambulatory Visit (INDEPENDENT_AMBULATORY_CARE_PROVIDER_SITE_OTHER): Payer: BLUE CROSS/BLUE SHIELD | Admitting: Internal Medicine

## 2017-01-16 VITALS — BP 138/80 | HR 71 | Temp 98.0°F | Wt 235.5 lb

## 2017-01-16 DIAGNOSIS — I10 Essential (primary) hypertension: Secondary | ICD-10-CM

## 2017-01-16 DIAGNOSIS — Z90710 Acquired absence of both cervix and uterus: Secondary | ICD-10-CM | POA: Diagnosis not present

## 2017-01-16 DIAGNOSIS — Z880 Allergy status to penicillin: Secondary | ICD-10-CM | POA: Diagnosis not present

## 2017-01-16 DIAGNOSIS — Z888 Allergy status to other drugs, medicaments and biological substances status: Secondary | ICD-10-CM | POA: Insufficient documentation

## 2017-01-16 DIAGNOSIS — Z6841 Body Mass Index (BMI) 40.0 and over, adult: Secondary | ICD-10-CM | POA: Diagnosis not present

## 2017-01-16 DIAGNOSIS — Z79899 Other long term (current) drug therapy: Secondary | ICD-10-CM | POA: Insufficient documentation

## 2017-01-16 DIAGNOSIS — R0600 Dyspnea, unspecified: Secondary | ICD-10-CM | POA: Diagnosis not present

## 2017-01-16 DIAGNOSIS — F419 Anxiety disorder, unspecified: Secondary | ICD-10-CM | POA: Diagnosis not present

## 2017-01-16 DIAGNOSIS — I119 Hypertensive heart disease without heart failure: Secondary | ICD-10-CM | POA: Diagnosis not present

## 2017-01-16 DIAGNOSIS — R072 Precordial pain: Principal | ICD-10-CM | POA: Insufficient documentation

## 2017-01-16 DIAGNOSIS — M7989 Other specified soft tissue disorders: Secondary | ICD-10-CM | POA: Insufficient documentation

## 2017-01-16 DIAGNOSIS — Z91018 Allergy to other foods: Secondary | ICD-10-CM | POA: Insufficient documentation

## 2017-01-16 DIAGNOSIS — Z7982 Long term (current) use of aspirin: Secondary | ICD-10-CM | POA: Diagnosis not present

## 2017-01-16 DIAGNOSIS — R600 Localized edema: Secondary | ICD-10-CM | POA: Diagnosis not present

## 2017-01-16 DIAGNOSIS — E559 Vitamin D deficiency, unspecified: Secondary | ICD-10-CM | POA: Insufficient documentation

## 2017-01-16 DIAGNOSIS — R0602 Shortness of breath: Secondary | ICD-10-CM | POA: Insufficient documentation

## 2017-01-16 DIAGNOSIS — E876 Hypokalemia: Secondary | ICD-10-CM | POA: Insufficient documentation

## 2017-01-16 DIAGNOSIS — I2510 Atherosclerotic heart disease of native coronary artery without angina pectoris: Secondary | ICD-10-CM | POA: Diagnosis not present

## 2017-01-16 DIAGNOSIS — R0989 Other specified symptoms and signs involving the circulatory and respiratory systems: Secondary | ICD-10-CM | POA: Insufficient documentation

## 2017-01-16 DIAGNOSIS — R609 Edema, unspecified: Secondary | ICD-10-CM | POA: Diagnosis not present

## 2017-01-16 DIAGNOSIS — Z818 Family history of other mental and behavioral disorders: Secondary | ICD-10-CM | POA: Insufficient documentation

## 2017-01-16 DIAGNOSIS — E669 Obesity, unspecified: Secondary | ICD-10-CM | POA: Diagnosis present

## 2017-01-16 DIAGNOSIS — R079 Chest pain, unspecified: Secondary | ICD-10-CM

## 2017-01-16 DIAGNOSIS — Z8249 Family history of ischemic heart disease and other diseases of the circulatory system: Secondary | ICD-10-CM | POA: Insufficient documentation

## 2017-01-16 DIAGNOSIS — F329 Major depressive disorder, single episode, unspecified: Secondary | ICD-10-CM | POA: Diagnosis not present

## 2017-01-16 DIAGNOSIS — Z87891 Personal history of nicotine dependence: Secondary | ICD-10-CM | POA: Diagnosis not present

## 2017-01-16 DIAGNOSIS — D649 Anemia, unspecified: Secondary | ICD-10-CM | POA: Diagnosis not present

## 2017-01-16 DIAGNOSIS — K219 Gastro-esophageal reflux disease without esophagitis: Secondary | ICD-10-CM | POA: Diagnosis not present

## 2017-01-16 DIAGNOSIS — R071 Chest pain on breathing: Secondary | ICD-10-CM | POA: Diagnosis not present

## 2017-01-16 LAB — CBC
HEMATOCRIT: 32.1 % — AB (ref 36.0–46.0)
HEMATOCRIT: 32.7 % — AB (ref 36.0–46.0)
HEMOGLOBIN: 11.1 g/dL — AB (ref 12.0–15.0)
HEMOGLOBIN: 11.2 g/dL — AB (ref 12.0–15.0)
MCH: 28.8 pg (ref 26.0–34.0)
MCH: 28.6 pg (ref 26.0–34.0)
MCHC: 34.6 g/dL (ref 30.0–36.0)
MCHC: 34.3 g/dL (ref 30.0–36.0)
MCV: 83.2 fL (ref 78.0–100.0)
MCV: 83.4 fL (ref 78.0–100.0)
PLATELETS: 250 10*3/uL (ref 150–400)
Platelets: 240 10*3/uL (ref 150–400)
RBC: 3.86 MIL/uL — ABNORMAL LOW (ref 3.87–5.11)
RBC: 3.92 MIL/uL (ref 3.87–5.11)
RDW: 13.8 % (ref 11.5–15.5)
RDW: 13.5 % (ref 11.5–15.5)
WBC: 7.4 10*3/uL (ref 4.0–10.5)
WBC: 8.8 10*3/uL (ref 4.0–10.5)

## 2017-01-16 LAB — CREATININE, SERUM
CREATININE: 0.71 mg/dL (ref 0.44–1.00)
GFR calc Af Amer: >60 mL/min (ref >60)
GFR calc non Af Amer: >60 mL/min (ref >60)

## 2017-01-16 LAB — I-STAT TROPONIN, ED: Troponin i, poc: 0 ng/mL (ref 0.00–0.08)

## 2017-01-16 LAB — TROPONIN I

## 2017-01-16 LAB — BASIC METABOLIC PANEL
ANION GAP: 9 (ref 5–15)
BUN: 11 mg/dL (ref 6–20)
CHLORIDE: 102 mmol/L (ref 101–111)
CO2: 28 mmol/L (ref 22–32)
Calcium: 8.9 mg/dL (ref 8.9–10.3)
Creatinine, Ser: 0.68 mg/dL (ref 0.44–1.00)
GFR calc Af Amer: >60 mL/min (ref >60)
GFR calc non Af Amer: >60 mL/min (ref >60)
GLUCOSE: 104 mg/dL — AB (ref 65–99)
POTASSIUM: 3.2 mmol/L — AB (ref 3.5–5.1)
Sodium: 139 mmol/L (ref 135–145)

## 2017-01-16 LAB — BRAIN NATRIURETIC PEPTIDE: B Natriuretic Peptide: 136.1 pg/mL — ABNORMAL HIGH (ref 0.0–100.0)

## 2017-01-16 MED ORDER — DIETHYLPROPION HCL ER 75 MG PO TB24: 1.0000 | ORAL_TABLET | Freq: Every day | ORAL | Status: DC

## 2017-01-16 MED ORDER — NEBIVOLOL HCL 10 MG PO TABS
10.0000 mg | ORAL_TABLET | Freq: Every day | ORAL | Status: DC
  Administered 2017-01-17: 10 mg via ORAL
  Filled 2017-01-16: qty 1

## 2017-01-16 MED ORDER — ATORVASTATIN CALCIUM 40 MG PO TABS: 40.0000 mg | ORAL_TABLET | Freq: Every day | ORAL | Status: DC

## 2017-01-16 MED ORDER — ACETAMINOPHEN 325 MG PO TABS
650.0000 mg | ORAL_TABLET | ORAL | Status: DC | PRN
  Administered 2017-01-16 – 2017-01-17 (×2): 650 mg via ORAL
  Filled 2017-01-16 (×2): qty 2

## 2017-01-16 MED ORDER — ASPIRIN 81 MG PO CHEW
324.0000 mg | CHEWABLE_TABLET | ORAL | Status: AC
  Administered 2017-01-16: 324 mg via ORAL
  Filled 2017-01-16: qty 4

## 2017-01-16 MED ORDER — SERTRALINE HCL 100 MG PO TABS
100.0000 mg | ORAL_TABLET | Freq: Every day | ORAL | Status: DC
  Administered 2017-01-17: 100 mg via ORAL
  Filled 2017-01-16: qty 1

## 2017-01-16 MED ORDER — ONDANSETRON HCL 4 MG/2ML IJ SOLN: 4.0000 mg | Freq: Four times a day (QID) | INTRAMUSCULAR | Status: DC | PRN

## 2017-01-16 MED ORDER — HEPARIN SODIUM (PORCINE) 5000 UNIT/ML IJ SOLN
5000.0000 [IU] | Freq: Three times a day (TID) | INTRAMUSCULAR | Status: DC
  Administered 2017-01-16 – 2017-01-17 (×2): 5000 [IU] via SUBCUTANEOUS
  Filled 2017-01-16 (×2): qty 1

## 2017-01-16 MED ORDER — FUROSEMIDE 40 MG PO TABS
40.0000 mg | ORAL_TABLET | Freq: Every day | ORAL | Status: DC
  Administered 2017-01-17: 40 mg via ORAL
  Filled 2017-01-16: qty 1

## 2017-01-16 MED ORDER — ASPIRIN EC 81 MG PO TBEC
81.0000 mg | DELAYED_RELEASE_TABLET | Freq: Every day | ORAL | Status: DC
  Administered 2017-01-17: 81 mg via ORAL
  Filled 2017-01-16: qty 1

## 2017-01-16 MED ORDER — POTASSIUM CHLORIDE CRYS ER 20 MEQ PO TBCR
40.0000 meq | EXTENDED_RELEASE_TABLET | Freq: Once | ORAL | Status: AC
  Administered 2017-01-16: 40 meq via ORAL
  Filled 2017-01-16: qty 2

## 2017-01-16 MED ORDER — ASPIRIN 300 MG RE SUPP
300.0000 mg | RECTAL | Status: AC
  Filled 2017-01-16: qty 1

## 2017-01-16 MED ORDER — PANTOPRAZOLE SODIUM 40 MG PO TBEC
40.0000 mg | DELAYED_RELEASE_TABLET | Freq: Every day | ORAL | Status: DC
  Administered 2017-01-17: 40 mg via ORAL
  Filled 2017-01-16 (×2): qty 1

## 2017-01-16 MED ORDER — NITROGLYCERIN 0.4 MG SL SUBL: 0.4000 mg | SUBLINGUAL_TABLET | SUBLINGUAL | Status: DC | PRN

## 2017-01-16 NOTE — ED Triage Notes (Signed)
Pt to ER sent by PCP for cardiac workup. States has been experiencing worsening lower extremity swelling, worse in the last couple of days. States onset of shortness of breath and chest pressure yesterday, states chest pressure occurred with exertion. States PCP started her on 10 mg lasix Friday, states did not experience much urinary output, PCP bumped it up to 20 mg and still felt she was not getting rid of fluid appropriately. NAD at triage. VSS. A/o x4.

## 2017-01-16 NOTE — Patient Instructions (Signed)
Proceed to the emergency department for further evaluation. Husband will come transport her.

## 2017-01-16 NOTE — Progress Notes (Signed)
   Subjective:    Patient ID: Andrea Thompson, female    DOB: [birth year], 51 y.o.   MRN: [id number]  HPI 51 year old White Female who has had some issues recently with dependent edema. I asked her to take Lasix 20 mg daily and Bystolic 10 mg daily when I saw her on September 28. She called yesterday saying she had much more swelling. Asked her to take 40 mg of Lasix yesterday but she did not do that. Apparently she had some substernal chest pain yesterday morning that she did not tell us about yesterday when she called. We did a chest x-ray yesterday and it shows pulmonary vascular congestion but no frank pulmonary edema. She is here today for follow-up. She says she gets short of breath walking across the parking lot. 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 Chest pain yesterday morning was substernal. No apparent radiation. She is having some left arm pain sounds musculoskeletal. Says she had significant edema yesterday but that it is somewhat improved. She did take 40 mg of Lasix this morning. She did feel nauseated with the chest pain yesterday. Chest pain lasted about 30 minutes. 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 She does not smoke. Her mother has history of cardiac issues. She has a history of GE reflux. No history of hyperlipidemia.    Review of Systems see above     Objective:   Physical Exam Skin warm and dry. Nodes none. Neck is supple without JVD or thyromegaly. No bruits. Chest clear to auscultation but decreased breath sounds in the bases. Cardiac exam regular rate and rhythm without murmur. One plus lower extremity edema nonpitting. Pulse ox is 97%. Blood pressure 138/80. Pulse 71.      Assessment & Plan:  EKG shows no acute changes from EKG 2006.  However her symptoms are concerning for coronary disease  Plan: I'm going to have her to proceed to the Emergency Department for further evaluation. She will call her  husband to transport her.

## 2017-01-16 NOTE — ED Provider Notes (Signed)
[facility]-EMERGENCY DEPT Provider Note   CSN: [id number] Arrival date & time: 01/16/17  1213     History   Chief Complaint Chief Complaint  Patient presents with  . Shortness of Breath  . Leg Swelling    HPI Andrea Thompson is a 51 y.o. female.  51yo F w/ PMH including HTN, GERD, anxiety/depression who p/w SOB and leg swelling. Recently, the patient has been having problems with worsening bilateral lower extremity edema. Her PCP started her on 10 mg Lasix 6 days ago. Initially she had no change and was increased to 20 mg. She took 40 mg this morning. She was seen again by PCP today and mentioned some exertional chest pressure and shortness of breath thus was sent here for evaluation. She states that yesterday she had approximately 30 minute episode of left-sided, nonradiating chest pressure and shortness of breath after she had walked from her car in the parking lot. She also had associated nausea and diaphoresis. Symptoms improved spontaneously with rest. She again had some shortness of breath with walking this morning but denies any chest pain today. She is currently symptom free. No recent travel, history of blood clots, history of cancer, or OCP use. Family history notable for mother with multiple MIs starting at age 98.   The history is provided by the patient.  Shortness of Breath     Past Medical History:  Diagnosis Date  . Allergy    allergic rhinitis  . Anxiety   . Depression   . GERD (gastroesophageal reflux disease)   . Hypertension   . Migraine headache   . Vitamin D deficiency     Patient Active Problem List   Diagnosis Date Noted  . Obesity 05/04/2015  . IBS (irritable bowel syndrome) 10/12/2011  . Anxiety 04/14/2011  . History of migraine headaches 04/14/2011  . Essential hypertension 06/11/2010  . PALPITATIONS 06/11/2010  . DEPRESSION 06/08/2010  . GERD 06/08/2010    Past Surgical History:  Procedure Laterality Date  . ABDOMINAL HYSTERECTOMY      OB  History    No data available       Home Medications    Prior to Admission medications   Medication Sig Start Date End Date Taking? Authorizing Provider  Diethylpropion HCl CR 75 MG TB24 Take 1 tablet (75 mg total) by mouth daily. 12/31/16  Yes Baxley, Luanna Cole, MD  furosemide (LASIX) 20 MG tablet Take 1 tablet (20 mg total) by mouth daily. 01/10/17  Yes Baxley, Luanna Cole, MD  nebivolol (BYSTOLIC) 10 MG tablet Bystolic 10 mg tablet   Yes [provider]  pantoprazole (PROTONIX) 40 MG tablet TAKE 1 TABLET (40 MG TOTAL) BY MOUTH DAILY. 01/16/17  Yes Baxley, Luanna Cole, MD  sertraline (ZOLOFT) 100 MG tablet TAKE 1 TABLET (100 MG TOTAL) BY MOUTH DAILY. 01/05/17  Yes Baxley, Luanna Cole, MD    Family History Family History  Problem Relation Age of Onset  . Heart disease Mother   . Cancer Father     Social History Social History  Substance Use Topics  . Smoking status: Former Smoker    Packs/day: 1.00    Types: Cigarettes    Quit date: 10/01/2005  . Smokeless tobacco: Never Used  . Alcohol use No     Allergies   Penicillins; Imitrex [sumatriptan base]; and Cinnamon   Review of Systems Review of Systems  Respiratory: Positive for shortness of breath.    All other systems reviewed and are negative except that which was  mentioned in HPI   Physical Exam Updated Vital Signs BP (!) 147/76   Pulse 72   Temp 98.4 F (36.9 C) (Oral)   Resp 16   Wt 105.9 kg (233 lb 6 oz)   SpO2 98%   BMI 44.10 kg/m   Physical Exam  Constitutional: She is oriented to person, place, and time. She appears well-developed and well-nourished. No distress.  HENT:  Head: Normocephalic and atraumatic.  Moist mucous membranes  Eyes: Pupils are equal, round, and reactive to light. Conjunctivae are normal.  Neck: Neck supple. No JVD present.  Cardiovascular: Normal rate, regular rhythm and normal heart sounds.   No murmur heard. Pulmonary/Chest: Effort normal and breath sounds normal.  Abdominal: Soft.  Bowel sounds are normal. She exhibits no distension. There is no tenderness.  Musculoskeletal: She exhibits edema (1+ pitting BLE).  Neurological: She is alert and oriented to person, place, and time.  Fluent speech  Skin: Skin is warm and dry.  Psychiatric: She has a normal mood and affect. Judgment normal.  Nursing note and vitals reviewed.    ED Treatments / Results  Labs (all labs ordered are listed, but only abnormal results are displayed) Labs Reviewed  BASIC METABOLIC PANEL - Abnormal; Notable for the following:       Result Value   Potassium 3.2 (*)    Glucose, Bld 104 (*)    All other components within normal limits  CBC - Abnormal; Notable for the following:    RBC 3.86 (*)    Hemoglobin 11.1 (*)    HCT 32.1 (*)    All other components within normal limits  BRAIN NATRIURETIC PEPTIDE - Abnormal; Notable for the following:    B Natriuretic Peptide 136.1 (*)    All other components within normal limits  I-STAT TROPONIN, ED    EKG  EKG Interpretation  Date/Time:  Thursday January 16 2017 12:21:09 EDT Ventricular Rate:  69 PR Interval:  140 QRS Duration: 96 QT Interval:  406 QTC Calculation: 435 R Axis:   73 Text Interpretation:  Normal sinus rhythm Normal ECG mild T wave inversion and Q wave lead III, no previous Confirmed by Frederick Peers [phone]) on 01/16/2017 3:04:14 PM       Radiology Dg Chest 2 View  Result Date: 01/16/2017 CLINICAL DATA:  Chest pain EXAM: CHEST  2 VIEW COMPARISON:  None. FINDINGS: The heart size and mediastinal contours are within normal limits. Both lungs are clear. The visualized skeletal structures are unremarkable. IMPRESSION: No active cardiopulmonary disease. Electronically Signed   By: Alcide Clever M.D.   On: 01/16/2017 13:27   Dg Chest 2 View  Result Date: 01/15/2017 CLINICAL DATA:  Extremity swelling. EXAM: CHEST  2 VIEW COMPARISON:  None. FINDINGS: Mild cardiomegaly. Mild pulmonary vascular congestion. No focal consolidation,  pleural effusion, or pneumothorax. No acute osseous abnormality. IMPRESSION: Mild cardiomegaly and pulmonary vascular congestion without overt pulmonary edema. Electronically Signed   By: Obie Dredge M.D.   On: 01/15/2017 15:40    Procedures Procedures (including critical care time)  Medications Ordered in ED Medications  potassium chloride SA (K-DUR,KLOR-CON) CR tablet 40 mEq (40 mEq Oral Given 01/16/17 1511)     Initial Impression / Assessment and Plan / ED Course  I have reviewed the triage vital signs and the nursing notes.  Pertinent labs & imaging results that were available during my care of the patient were reviewed by me and considered in my medical decision making (see chart for details).  Pt w/ episode of exertional chest pain and SOB yesterday and recent problems with LE edema. She was well-appearing on exam, vital signs notable for mild hypertension at 147/76. Mild lower extremity edema with no abnormal lung sounds. Her chest x-ray is negative. Troponin,  BNP normal. Hgb 11. I am concerned about ACS given exertional nature of sx, and I also feel she needs an ECHO to r/o mild CHF. HEART score 4. She has no risk factors for PE and I feel this etiology is very unlikely. Patient accepted by cardiology team and admitted for further care.    Final Clinical Impressions(s) / ED Diagnoses   Final diagnoses:  Bilateral lower extremity edema  Hypokalemia  Chest pain, unspecified type    New Prescriptions New Prescriptions   No medications on file     Hedi Barkan, Ambrose Finland, MD 01/17/17 [phone]

## 2017-01-16 NOTE — ED Notes (Signed)
Pt ambulated independently to restroom by self.

## 2017-01-16 NOTE — ED Notes (Signed)
Dr. Little at bedside.  

## 2017-01-16 NOTE — ED Notes (Signed)
IV team started IV

## 2017-01-16 NOTE — H&P (Signed)
History & Physical    Patient ID: Andrea Thompson MRN: [id number], DOB/AGE: 1966-03-14   Admit date: 01/16/2017   Primary Physician: Margaree Mackintosh, MD Primary Cardiologist: Dr Shirlee Latch (last seen in 2012)  Patient Profile    Andrea Thompson is a 51 yo female with a PMH significant for obesity, HTN, GERD, anxiety, depression, and recent onset of lower extremity edema. She was sent to the ED from her PCP office for substernal chest pain.  Past Medical History    Past Medical History:  Diagnosis Date  . Allergy    allergic rhinitis  . Anxiety   . Depression   . GERD (gastroesophageal reflux disease)   . Hypertension   . Migraine headache   . Vitamin D deficiency     Past Surgical History:  Procedure Laterality Date  . ABDOMINAL HYSTERECTOMY       Allergies  Allergies  Allergen Reactions  . Penicillins Hives    Has patient had a PCN reaction causing immediate rash, facial/tongue/throat swelling, SOB or lightheadedness with hypotension: Yes Has patient had a PCN reaction causing severe rash involving mucus membranes or skin necrosis: No Has patient had a PCN reaction that required hospitalization: No Has patient had a PCN reaction occurring within the last 10 years: No If all of the above answers are "NO", then may proceed with Cephalosporin use.  . Imitrex [Sumatriptan Base] Nausea And Vomiting  . Cinnamon Itching, Swelling and Rash    History of Present Illness    Andrea Thompson was seen by our service in 2012 for bigeminy, palpitations, and chest pain. She went to [facility] for overnight observation and was ruled out for MI. However, bigeminy was noted on telemetry and she was asked to follow up as an outpatient. She was asked to wear a Holter monitor (can't find records in [facility]). Of note, the patient denies these events.  She has been seeing her PCP (Baxley) for lower extremity edema. Today, she followed up in her office and described substernal chest pain yesterday.  She was asked to report to the ED for further workup.   On arrival, she states that yesterday she woke up with a nagging chest pain in her left chest. The pain lasted several minutes and resolved. The pain was associated with SOB, nausea, palpitations, and dizziness. She states that she "just didn't feel well yesterday."  She reported to work. When she was walking from the car to the building, the chest pain returned and was associated with the same above symptoms. The pain resolved once she got to work, but she continued to have dyspnea on exertion with walking at work. She generally goes to the gym and works out on the elliptical and states she dan exercise for 2 miles without SOB, so this DOE was very unusual for her. She also describes having lower extremity swelling since last week. The swelling is in her legs and she was taking  Lasix. Lasix helped the swelling yesterday when she was instructed to double her dose to 40 mg. She states that she has slept on 2 pillows for many years for comfort, not due to orthopnea. She has never felt this chest pain or DOE before. She has a history of tobacco use but quit 10 years ago. She also has a strong family history of heart disease: mother had first MI at age 70 and has had 5 MI's; father had MI at age 61. She has a history of HTN.  No  A1c is in EPIC, no history of DM. LDL in March 2018 was 98.  She is currently chest pain free and her main complaint is her SOB and lower extremity swelling.    Home Medications    Prior to Admission medications   Medication Sig Start Date End Date Taking? Authorizing Provider  Diethylpropion HCl CR 75 MG TB24 Take 1 tablet (75 mg total) by mouth daily. 12/31/16  Yes Baxley, Luanna Cole, MD  furosemide (LASIX) 20 MG tablet Take 1 tablet (20 mg total) by mouth daily. 01/10/17  Yes Baxley, Luanna Cole, MD  nebivolol (BYSTOLIC) 10 MG tablet Bystolic 10 mg tablet   Yes [provider]  pantoprazole (PROTONIX) 40 MG tablet TAKE 1  TABLET (40 MG TOTAL) BY MOUTH DAILY. 01/16/17  Yes Baxley, Luanna Cole, MD  sertraline (ZOLOFT) 100 MG tablet TAKE 1 TABLET (100 MG TOTAL) BY MOUTH DAILY. 01/05/17  Yes Baxley, Luanna Cole, MD    Family History     Family History  Problem Relation Age of Onset  . Heart disease Mother   . Cancer Father     Social History    Social History   Social History  . Marital status: Married    Spouse name: N/A  . Number of children: N/A  . Years of education: N/A   Occupational History  . Not on file.   Social History Main Topics  . Smoking status: Former Smoker    Packs/day: 1.00    Types: Cigarettes    Quit date: 10/01/2005  . Smokeless tobacco: Never Used  . Alcohol use No  . Drug use: Unknown  . Sexual activity: Not on file   Other Topics Concern  . Not on file   Social History Narrative  . No narrative on file     Review of Systems    General:  No chills, fever, night sweats or weight changes.  Cardiovascular:  No chest pain, + dyspnea on exertion, + edema, no orthopnea, + palpitations, paroxysmal nocturnal dyspnea. Dermatological: No rash, lesions/masses Respiratory: No cough, dyspnea Urologic: No hematuria, dysuria Abdominal:   + nausea, no vomiting, diarrhea, bright red blood per rectum, melena, or hematemesis Neurologic:  No visual changes, changes in mental status. All other systems reviewed and are otherwise negative except as noted above.  Physical Exam    Blood pressure (!) 147/76, pulse 72, temperature 98.4 F (36.9 C), temperature source Oral, resp. rate 16, weight 233 lb 6 oz (105.9 kg), SpO2 98 %.  General: Pleasant, NAD Psych: Normal affect. Neuro: Alert and oriented X 3. Moves all extremities spontaneously. HEENT: Normal  Neck: Supple without bruits or JVD, exam difficult. Lungs:  Resp regular and unlabored, CTA. Heart: RRR no s3, s4, or murmurs. Abdomen: Soft, non-tender, non-distended, BS + x 4.  Extremities: No clubbing, cyanosis, 1+ LE edema.  DP/PT/Radials 2+ and equal bilaterally.  Labs    Troponin [facility] of Care Test)  Recent Labs  01/16/17 1249  TROPIPOC 0.00   No results for input(s): CKTOTAL, CKMB, TROPONINI in the last 72 hours. Lab Results  Component Value Date   WBC 7.4 01/16/2017   HGB 11.1 (L) 01/16/2017   HCT 32.1 (L) 01/16/2017   MCV 83.2 01/16/2017   PLT 240 01/16/2017    Recent Labs Lab 01/16/17 1224  NA 139  K 3.2*  CL 102  CO2 28  BUN 11  CREATININE 0.68  CALCIUM 8.9  GLUCOSE 104*   Lab Results  Component Value Date  CHOL 151 06/21/2016   HDL 39 (L) 06/21/2016   LDLCALC 98 06/21/2016   TRIG 70 06/21/2016   No results found for: [facility]   Radiology Studies    Dg Chest 2 View  Result Date: 01/16/2017 CLINICAL DATA:  Chest pain EXAM: CHEST  2 VIEW COMPARISON:  None. FINDINGS: The heart size and mediastinal contours are within normal limits. Both lungs are clear. The visualized skeletal structures are unremarkable. IMPRESSION: No active cardiopulmonary disease. Electronically Signed   By: Alcide Clever M.D.   On: 01/16/2017 13:27   Dg Chest 2 View  Result Date: 01/15/2017 CLINICAL DATA:  Extremity swelling. EXAM: CHEST  2 VIEW COMPARISON:  None. FINDINGS: Mild cardiomegaly. Mild pulmonary vascular congestion. No focal consolidation, pleural effusion, or pneumothorax. No acute osseous abnormality. IMPRESSION: Mild cardiomegaly and pulmonary vascular congestion without overt pulmonary edema. Electronically Signed   By: Obie Dredge M.D.   On: 01/15/2017 15:40    ECG & Cardiac Imaging    EKG 01/16/17: sinus with Q waves in inferior leads  Echocardiogram pending  Assessment & Plan    1. Chest pain - troponin POC negative - EKG with sinus rhythm and questionable Q waves in inferior leads Chest pain has typical and atypical features. She has a history of smoking, HTN, and strong family history of ACS. Will obtain echocardiogram. She will likely need an ischemic evaluation. Depending on  echo results and CE overnight, will tentatively plan for treadmill myoview tomorrow.   2. Dependent edema - on lasix regimen followed by her PCP - echocardiogram pending to evaluate structure and function   3. Hypokalemia - likely secondary to lasix without potassium supplementation - PO replacement   4. HTN - home: bystolic - home pressures run in the 130s   Signed, Marcelino Duster, PA-C 01/16/2017, 3:44 PM

## 2017-01-17 ENCOUNTER — Telehealth (HOSPITAL_COMMUNITY): Payer: Self-pay | Admitting: Physician Assistant

## 2017-01-17 ENCOUNTER — Other Ambulatory Visit: Payer: Self-pay | Admitting: Physician Assistant

## 2017-01-17 ENCOUNTER — Observation Stay (HOSPITAL_BASED_OUTPATIENT_CLINIC_OR_DEPARTMENT_OTHER): Payer: BLUE CROSS/BLUE SHIELD

## 2017-01-17 DIAGNOSIS — I10 Essential (primary) hypertension: Secondary | ICD-10-CM

## 2017-01-17 DIAGNOSIS — E876 Hypokalemia: Secondary | ICD-10-CM

## 2017-01-17 DIAGNOSIS — D649 Anemia, unspecified: Secondary | ICD-10-CM

## 2017-01-17 DIAGNOSIS — R600 Localized edema: Secondary | ICD-10-CM | POA: Diagnosis not present

## 2017-01-17 DIAGNOSIS — R079 Chest pain, unspecified: Secondary | ICD-10-CM | POA: Diagnosis not present

## 2017-01-17 DIAGNOSIS — E559 Vitamin D deficiency, unspecified: Secondary | ICD-10-CM | POA: Diagnosis not present

## 2017-01-17 DIAGNOSIS — R072 Precordial pain: Secondary | ICD-10-CM

## 2017-01-17 DIAGNOSIS — R0600 Dyspnea, unspecified: Secondary | ICD-10-CM | POA: Diagnosis not present

## 2017-01-17 DIAGNOSIS — F329 Major depressive disorder, single episode, unspecified: Secondary | ICD-10-CM | POA: Diagnosis not present

## 2017-01-17 DIAGNOSIS — I119 Hypertensive heart disease without heart failure: Secondary | ICD-10-CM | POA: Diagnosis not present

## 2017-01-17 LAB — BASIC METABOLIC PANEL
ANION GAP: 7 (ref 5–15)
BUN: 9 mg/dL (ref 6–20)
CHLORIDE: 103 mmol/L (ref 101–111)
CO2: 26 mmol/L (ref 22–32)
Calcium: 8.7 mg/dL — ABNORMAL LOW (ref 8.9–10.3)
Creatinine, Ser: 0.6 mg/dL (ref 0.44–1.00)
GFR calc Af Amer: >60 mL/min (ref >60)
Glucose, Bld: 91 mg/dL (ref 65–99)
POTASSIUM: 3.2 mmol/L — AB (ref 3.5–5.1)
SODIUM: 136 mmol/L (ref 135–145)

## 2017-01-17 LAB — HEMOGLOBIN A1C
HEMOGLOBIN A1C: 5.1 % (ref 4.8–5.6)
MEAN PLASMA GLUCOSE: 99.67 mg/dL

## 2017-01-17 LAB — LIPID PANEL
Cholesterol: 147 mg/dL (ref 0–200)
HDL: 29 mg/dL — ABNORMAL LOW (ref >40)
LDL CALC: 93 mg/dL (ref 0–99)
Total CHOL/HDL Ratio: 5.1 RATIO
Triglycerides: 124 mg/dL (ref <150)
VLDL: 25 mg/dL (ref 0–40)

## 2017-01-17 LAB — ECHOCARDIOGRAM COMPLETE
HEIGHTINCHES: 61 in
Weight: 3715.2 oz

## 2017-01-17 LAB — HEPATIC FUNCTION PANEL
ALBUMIN: 3.6 g/dL (ref 3.5–5.0)
ALT: 19 U/L (ref 14–54)
AST: 14 U/L — AB (ref 15–41)
Alkaline Phosphatase: 74 U/L (ref 38–126)
BILIRUBIN DIRECT: 0.1 mg/dL (ref 0.1–0.5)
Indirect Bilirubin: 0.6 mg/dL (ref 0.3–0.9)
TOTAL PROTEIN: 6.9 g/dL (ref 6.5–8.1)
Total Bilirubin: 0.7 mg/dL (ref 0.3–1.2)

## 2017-01-17 LAB — CBC
HEMATOCRIT: 32.6 % — AB (ref 36.0–46.0)
HEMOGLOBIN: 11 g/dL — AB (ref 12.0–15.0)
MCH: 28.5 pg (ref 26.0–34.0)
MCHC: 33.7 g/dL (ref 30.0–36.0)
MCV: 84.5 fL (ref 78.0–100.0)
Platelets: 233 10*3/uL (ref 150–400)
RBC: 3.86 MIL/uL — AB (ref 3.87–5.11)
RDW: 13.7 % (ref 11.5–15.5)
WBC: 6.9 10*3/uL (ref 4.0–10.5)

## 2017-01-17 LAB — BRAIN NATRIURETIC PEPTIDE: B NATRIURETIC PEPTIDE 5: 131.5 pg/mL — AB (ref 0.0–100.0)

## 2017-01-17 LAB — TROPONIN I: Troponin I: <0.03 ng/mL (ref <0.03)

## 2017-01-17 LAB — MAGNESIUM: Magnesium: 1.7 mg/dL (ref 1.7–2.4)

## 2017-01-17 LAB — HIV ANTIBODY (ROUTINE TESTING W REFLEX): HIV Screen 4th Generation wRfx: NONREACTIVE

## 2017-01-17 MED ORDER — POTASSIUM CHLORIDE CRYS ER 20 MEQ PO TBCR: 40.0000 meq | EXTENDED_RELEASE_TABLET | Freq: Every day | ORAL | Status: DC

## 2017-01-17 MED ORDER — POTASSIUM CHLORIDE CRYS ER 20 MEQ PO TBCR: 40.0000 meq | EXTENDED_RELEASE_TABLET | Freq: Every day | ORAL | 3 refills | Status: DC

## 2017-01-17 MED ORDER — PERFLUTREN LIPID MICROSPHERE
1.0000 mL | INTRAVENOUS | Status: AC | PRN
  Administered 2017-01-17: 3 mL via INTRAVENOUS
  Filled 2017-01-17: qty 10

## 2017-01-17 MED ORDER — MAGNESIUM OXIDE 400 (241.3 MG) MG PO TABS: 400.0000 mg | ORAL_TABLET | Freq: Every day | ORAL | 2 refills | Status: DC

## 2017-01-17 MED ORDER — ATORVASTATIN CALCIUM 40 MG PO TABS: 40.0000 mg | ORAL_TABLET | Freq: Every evening | ORAL | 3 refills | Status: DC

## 2017-01-17 MED ORDER — POTASSIUM CHLORIDE CRYS ER 20 MEQ PO TBCR
40.0000 meq | EXTENDED_RELEASE_TABLET | ORAL | Status: DC
  Administered 2017-01-17: 40 meq via ORAL
  Filled 2017-01-17: qty 2

## 2017-01-17 MED ORDER — MAGNESIUM OXIDE 400 (241.3 MG) MG PO TABS
400.0000 mg | ORAL_TABLET | Freq: Every day | ORAL | Status: DC
  Administered 2017-01-17: 400 mg via ORAL
  Filled 2017-01-17: qty 1

## 2017-01-17 MED ORDER — FUROSEMIDE 40 MG PO TABS: 40.0000 mg | ORAL_TABLET | Freq: Every day | ORAL | 3 refills | Status: DC

## 2017-01-17 MED ORDER — ASPIRIN 81 MG PO TBEC
81.0000 mg | DELAYED_RELEASE_TABLET | Freq: Every day | ORAL | 3 refills | Status: DC
Start: 2017-01-18 — End: 2018-01-03

## 2017-01-17 NOTE — Progress Notes (Signed)
  Echocardiogram 2D Echocardiogram with definity has been performed.  Leta Jungling M 01/17/2017, 8:23 AM

## 2017-01-17 NOTE — Progress Notes (Signed)
Pt has orders to be discharged. Discharge instructions given and pt has no additional questions at this time. Medication regimen reviewed and pt educated. Pt verbalized understanding and has no additional questions. Telemetry box removed. IV removed and site in good condition. Pt stable and waiting for transportation.  Chailyn Racette RN 

## 2017-01-17 NOTE — Discharge Summary (Signed)
Discharge Summary    Patient ID: Andrea Thompson,  MRN: [id number], DOB/AGE: 05-09-1965 51 y.o.  Admit date: 01/16/2017 Discharge date: 01/17/2017  Primary Care Provider: Margaree Mackintosh Primary Cardiologist: Dr. Rennis Golden (new this admission)  Discharge Diagnoses    Principal Problem:   Chest pain Active Problems:   Essential hypertension   Obesity   Bilateral lower extremity edema   Hypokalemia   Normocytic anemia  Diagnostic Studies/Procedures    2D echo 01/17/17 Study Conclusions  - Left ventricle: The cavity size was normal. There was moderate   focal basal hypertrophy. Systolic function was normal. The   estimated ejection fraction was in the range of 55% to 60%. Wall   motion was normal; there were no regional wall motion   abnormalities. Left ventricular diastolic function parameters   were normal. - Mitral valve: Valve area by pressure half-time: 1.8 cm^2. _____________     History of Present Illness     Andrea Thompson is a 51 y.o. female with history of obesity, HTN, GERD, anxiety, depression, strong family hx of CAD, recent onset of upper and lower extremity edema who was admitted with substernal CP and dyspnea from PCP's office. 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  [name] was seen by our service in 2012 for bigeminy, palpitations, and chest pain. She went to [facility] for overnight observation and was ruled out for MI. However, bigeminy was noted on telemetry and she was asked to follow up as an outpatient. She was asked to wear a Holter monitor (no records in [facility]). Of note, the patient denied these events.   She has had chronic mild left lower extremity edema ever since the birth of her last child which was over 20 years ago. More recently she had been seeing her PCP for blood pressure management. On 12/31/16 she was placed back on Bystolic which she had previously been on. To accomodate this readdition, her losartan/HCTZ was  intended to be discontinued with continuation only of the HCTZ component. She was on amlodipine at that visit. On 01/10/17, she presented with increased tightness/swelling of her hands as well as increased swelling in her lower extremities. She had inadvertantly been taking the losartan with the losartan HCTZ. The losartan HCTZ was discontinued. HCTZ was changed to Lasix  daily. At some point between these visits, amlodipine fell off her medicine list. The patient reports Dr. Lenord Fellers had stopped this.  She called into PCP's office 01/15/17 reporting increasing edema, with recommendation to increase Lasix to  daily. Unfortunately the patient didn't have her pills with her at work so she was unable to do so. CXR that day showed mild cardiomegaly and pulmonary vascular congestion without overt edema. On day of admission, while walking in the parking lot at work, she hag nagging chest pain associated with DOE. The pain resolved once she got to work, but she continued to have dyspnea on exertion with walking at work. She generally goes to the gym and works out on the elliptical and states she dan exercise for 2 miles without SOB, so this DOE was very unusual for her.  She also has a strong family history of heart disease: mother had first MI at age 2 and has had 5 MI's; father had MI at age 3. She was admitted for further evaluation.   Hospital Course    1. Chest pain/dyspnea on exertion - ruled out for MI. EKG nonacute. 2D echo reassuring with normal LV function  and no RWMA. With increased dose of Lasix, symptoms have resolved. Will plan for outpatient exercise stress test. She was instructed not to take her nebivolol the day of the test. With strong cardiac family history, Dr. Rennis Golden recommended statin initiation to achieve a goal LDL of <70 (currently 93). If the patient is tolerating statin at time of follow-up appointment, would consider rechecking liver function/lipid panel in 6-8 weeks. Dr. Rennis Golden was  OK with her continuing weight loss medication (diethylproprion).  2. Upper and lower extremity edema and mild pulm vascular congestion on CXR 01/15/17 - resolved on higher dose of Lasix  daily. She reports chronic L>RLE ever since birth of her last child over 20 years ago. No recent travel, surgery, bedrest. Not tachycardic, tachypneic and hypoxic. Etiology of the swelling is not totally clear given normal diastolic function on echo. She did have moderate LVH which could be contributing. BNP was 131. Will discharge home on higher dose of Lasix ( ) daily given that she feels it has resolved her symptoms back to baseline. Note at some point last month she was on amlodipine but this got stopped between two office visits. Do not see overt reference to stopping but patient reports she was taken off of this sometime last month. This could also have been contributing to exacerbation of edema. Would avoid in the future if possible so as not to complicate the picture.   3. HTN - follow on current regimen.  4. Hypokalemia - likely related to several days of diuretic use without prior potassium supplementation. She received daily in the hospital. She will be instructed to take an additional this afternoon after discharge, then start daily tomorrow. She also had mildly decreased magnesium level of 1.7, so will also send home on MagOx  daily. Recommend recheck BMET/Mg at time of stress test.  5. Anemia - will need further attention to this on f/u with PCP. No reported bleeding. _____________  Discharge Vitals Blood pressure (!) 150/54, pulse (!) 59, temperature 98 F (36.7 C), temperature source Oral, resp. rate 18, height  (1.549 m), weight 232 lb 3.2 oz (105.3 kg), SpO2 98 %.  Filed Weights   01/16/17 1532 01/16/17 2109 01/17/17 0633  Weight: 233 lb 6 oz (105.9 kg) 231 lb 9.6 oz (105.1 kg) 232 lb 3.2 oz (105.3 kg)    Labs & Radiologic Studies    CBC  Recent Labs   01/16/17 2219 01/17/17 0522  WBC 8.8 6.9  HGB 11.2* 11.0*  HCT 32.7* 32.6*  MCV 83.4 84.5  PLT 250 233   Basic Metabolic Panel  Recent Labs  01/16/17 1224 01/16/17 2219 01/17/17 0522  NA 139  --  136  K 3.2*  --  3.2*  CL 102  --  103  CO2 28  --  26  GLUCOSE 104*  --  91  BUN 11  --  9  CREATININE 0.68 0.71 0.60  CALCIUM 8.9  --  8.7*  MG  --   --  1.7   Liver Function Tests  Recent Labs  01/17/17 1105  AST 14*  ALT 19  ALKPHOS 74  BILITOT 0.7  PROT 6.9  ALBUMIN 3.6   Cardiac Enzymes  Recent Labs  01/16/17 2219 01/17/17 0522 01/17/17 1002  TROPONINI <0.03 <0.03 <0.03   Hemoglobin A1C  Recent Labs  01/17/17 0522  HGBA1C 5.1   Fasting Lipid Panel  Recent Labs  01/17/17 0522  CHOL 147  HDL 29*  LDLCALC 93  TRIG 124  CHOLHDL 5.1    _____________  Dg Chest 2 View  Result Date: 01/16/2017 CLINICAL DATA:  Chest pain EXAM: CHEST  2 VIEW COMPARISON:  None. FINDINGS: The heart size and mediastinal contours are within normal limits. Both lungs are clear. The visualized skeletal structures are unremarkable. IMPRESSION: No active cardiopulmonary disease. Electronically Signed   By: Alcide Clever M.D.   On: 01/16/2017 13:27   Dg Chest 2 View  Result Date: 01/15/2017 CLINICAL DATA:  Extremity swelling. EXAM: CHEST  2 VIEW COMPARISON:  None. FINDINGS: Mild cardiomegaly. Mild pulmonary vascular congestion. No focal consolidation, pleural effusion, or pneumothorax. No acute osseous abnormality. IMPRESSION: Mild cardiomegaly and pulmonary vascular congestion without overt pulmonary edema. Electronically Signed   By: Obie Dredge M.D.   On: 01/15/2017 15:40   Disposition   Pt is being discharged home today in good condition.  Follow-up Plans & Appointments    Follow-up Information    [facility] Follow up.   Specialty:  Cardiology Why:  [facility] location - 01/24/17 at 7:30am. Arrive 15 minutes early to check in. Please  see last page of After-Visit Summary for stress test instructions. Do not take your nebivolol the day of the test. Contact information: [address] [place] [address] [phone]       [facility], [place] Follow up.   Specialties:  Cardiology, Radiology Why:  [facility] location - 01/28/17 at 9am. Arrive 15 minutes early to check in. Az[facility]e Course is one of the PAs that works closely with Dr. Blanchie Dessert care team. Contact information: [address] [place] [address] [phone]        Margaree Mackintosh, [place] Follow up.   Specialty:  Internal Medicine Why:  Your blood count showed you were mildly anemic with a hemoglobin level around 11.0 (normal is 12). Please follow up with your primary care provider for further monitoring. Contact information: [address] [place] [address] [phone]          Discharge Instructions    Diet - low sodium heart healthy    Complete by:  As directed    Increase activity slowly    Complete by:  As directed    Your potassium and magnesium levels were borderline low in the hospital, likely due to the diuretic medicine. You were started on potassium and magnesium. We will plan to recheck these when you come in for your stress test. Make sure to let them know you are due for labwork that day as well.  Please take an additional 2 tablets of potassium today (40 mEq dose) approximately 4 hours after the dose you received in the hospital today. Tomorrow, you will begin taking just 2 tablets daily ( total per day).  Your furosemide/Lasix dose has been increased to  daily. A new prescription was sent into your pharmacy. You may use up the  tablets you have at home by taking two per day, then begin taking one tablet daily of the new prescription.  You were also started on aspirin and atorvastatin to help decrease future cardiovascular risk.  Your blood count showed you were  mildly anemic with a hemoglobin level around 11.0 (normal is 12). Please follow up with your primary care provider for further monitoring.      Discharge Medications   Allergies as of 01/17/2017      Reactions   Penicillins Hives   Has patient had a PCN  reaction causing immediate rash, facial/tongue/throat swelling, SOB or lightheadedness with hypotension: Yes Has patient had a PCN reaction causing severe rash involving mucus membranes or skin necrosis: No Has patient had a PCN reaction that required hospitalization: No Has patient had a PCN reaction occurring within the last 10 years: No If all of the above answers are "NO", then may proceed with Cephalosporin use.   Imitrex [sumatriptan Base] Nausea And Vomiting   Cinnamon Itching, Swelling, Rash      Medication List    TAKE these medications   aspirin 81 MG EC tablet Take 1 tablet (81 mg total) by mouth daily.   atorvastatin 40 MG tablet Commonly known as:  LIPITOR Take 1 tablet (40 mg total) by mouth every evening.   BYSTOLIC 10 MG tablet Generic drug:  nebivolol Bystolic 10 mg tablet   Diethylpropion HCl CR 75 MG Tb24 Take 1 tablet (75 mg total) by mouth daily.   furosemide 40 MG tablet Commonly known as:  LASIX Take 1 tablet (40 mg total) by mouth daily. What changed:  medication strength  how much to take   magnesium oxide 400 (241.3 Mg) MG tablet Commonly known as:  MAG-OX Take 1 tablet (400 mg total) by mouth daily.   pantoprazole 40 MG tablet Commonly known as:  PROTONIX TAKE 1 TABLET (40 MG TOTAL) BY MOUTH DAILY.   potassium chloride SA 20 MEQ tablet Commonly known as:  K-DUR,KLOR-CON Take 2 tablets (40 mEq total) by mouth daily.   sertraline 100 MG tablet Commonly known as:  ZOLOFT TAKE 1 TABLET (100 MG TOTAL) BY MOUTH DAILY.        Allergies:  Allergies  Allergen Reactions  . Penicillins Hives    Has patient had a PCN reaction causing immediate rash, facial/tongue/throat swelling, SOB or  lightheadedness with hypotension: Yes Has patient had a PCN reaction causing severe rash involving mucus membranes or skin necrosis: No Has patient had a PCN reaction that required hospitalization: No Has patient had a PCN reaction occurring within the last 10 years: No If all of the above answers are "NO", then may proceed with Cephalosporin use.  . Imitrex [Sumatriptan Base] Nausea And Vomiting  . Cinnamon Itching, Swelling and Rash      Outstanding Labs/Studies   BMET, MG  Duration of Discharge Encounter   Greater than 30 minutes including physician time.  Signed, Tacey Ruiz Adrena Nakamura PA-C 01/17/2017, 12:30 PM

## 2017-01-17 NOTE — Progress Notes (Signed)
Progress Note  Patient Name: Andrea Thompson Date of Encounter: 01/17/2017  Primary Cardiologist: Remotely Dr. Shirlee Latch, now Dr. Rennis Golden  Subjective   Feeling much better. RLE edema resolved, LLE back to baseline (has been present ever since birth of child over 20 years ago). Has ambulated in the room without any further CP or SOB.  Inpatient Medications    Scheduled Meds: . aspirin EC  81 mg Oral Daily  . atorvastatin  40 mg Oral q1800  . furosemide  40 mg Oral Daily  . heparin  5,000 Units Subcutaneous Q8H  . nebivolol  10 mg Oral Daily  . pantoprazole  40 mg Oral Daily  . sertraline  100 mg Oral Daily   Continuous Infusions:  PRN Meds: acetaminophen, nitroGLYCERIN, ondansetron (ZOFRAN) IV   Vital Signs    Vitals:   01/16/17 2116 01/17/17 0022 01/17/17 0633 01/17/17 0700  BP: (!) 137/59 (!) 126/52 (!) 140/58 123/60  Pulse: 68 (!) 58 63 60  Resp: Temp: 98.5 F (36.9 C) 98 F (36.7 C) 98.5 F (36.9 C) 98.6 F (37 C)  TempSrc: Oral Oral Oral Oral  SpO2: 100% 100% 100% 97%  Weight:   232 lb 3.2 oz (105.3 kg)   Height:        Intake/Output Summary (Last 24 hours) at 01/17/17 1044 Last data filed at 01/17/17 1020  Gross per 24 hour  Intake              620 ml  Output              500 ml  Net              120 ml   Filed Weights   01/16/17 1532 01/16/17 2109 01/17/17 0633  Weight: 233 lb 6 oz (105.9 kg) 231 lb 9.6 oz (105.1 kg) 232 lb 3.2 oz (105.3 kg)    Telemetry    NSR - Personally Reviewed  ECG    NSR no acute changes - Personally Reviewed  Physical Exam   GEN: No acute distress.  HEENT: Normocephalic, atraumatic, sclera non-icteric. Neck: No JVD or bruits. Cardiac: RRR no murmurs, rubs, or gallops.  Radials/DP/PT 1+ and equal bilaterally.  Respiratory: Clear to auscultation bilaterally. Breathing is unlabored. GI: Soft, nontender, non-distended, BS +x 4. MS: no deformity. Extremities: No clubbing or cyanosis. Trace left sockline  edema, nonpitting. Distal pedal pulses are 2+ and equal bilaterally. Neuro:  AAOx3. Follows commands. Psych:  Responds to questions appropriately with a normal affect.  Labs    Chemistry Recent Labs Lab 01/16/17 1224 01/16/17 2219 01/17/17 0522  NA 139  --  136  K 3.2*  --  3.2*  CL 102  --  103  CO2 28  --  26  GLUCOSE 104*  --  91  BUN 11  --  9  CREATININE 0.68 0.71 0.60  CALCIUM 8.9  --  8.7*  GFRNONAA >60 >60 >60  GFRAA >60 >60 >60  ANIONGAP 9  --  7     Hematology Recent Labs Lab 01/16/17 1224 01/16/17 2219 01/17/17 0522  WBC 7.4 8.8 6.9  RBC 3.86* 3.92 3.86*  HGB 11.1* 11.2* 11.0*  HCT 32.1* 32.7* 32.6*  MCV 83.2 83.4 84.5  MCH 28.8 28.6 28.5  MCHC 34.6 34.3 33.7  RDW 13.8 13.5 13.7  PLT 240 250 233    Cardiac Enzymes Recent Labs Lab 01/16/17 2219 01/17/17 0522  TROPONINI <0.03 <0.03  Recent Labs Lab 01/16/17 1249  TROPIPOC 0.00     BNP Recent Labs Lab 01/16/17 1224 01/17/17 0522  BNP 136.1* 131.5*     DDimer No results for input(s): DDIMER in the last 168 hours.   Radiology    Dg Chest 2 View  Result Date: 01/16/2017 CLINICAL DATA:  Chest pain EXAM: CHEST  2 VIEW COMPARISON:  None. FINDINGS: The heart size and mediastinal contours are within normal limits. Both lungs are clear. The visualized skeletal structures are unremarkable. IMPRESSION: No active cardiopulmonary disease. Electronically Signed   By: Alcide Clever M.D.   On: 01/16/2017 13:27   Dg Chest 2 View  Result Date: 01/15/2017 CLINICAL DATA:  Extremity swelling. EXAM: CHEST  2 VIEW COMPARISON:  None. FINDINGS: Mild cardiomegaly. Mild pulmonary vascular congestion. No focal consolidation, pleural effusion, or pneumothorax. No acute osseous abnormality. IMPRESSION: Mild cardiomegaly and pulmonary vascular congestion without overt pulmonary edema. Electronically Signed   By: Obie Dredge M.D.   On: 01/15/2017 15:40    Cardiac Studies   Study Conclusions  - Left  ventricle: The cavity size was normal. There was moderate   focal basal hypertrophy. Systolic function was normal. The   estimated ejection fraction was in the range of 55% to 60%. Wall   motion was normal; there were no regional wall motion   abnormalities. Left ventricular diastolic function parameters   were normal. - Mitral valve: Valve area by pressure half-time: 1.8 cm^2.  Patient Profile     51 y.o. female with obesity, HTN, GERD, anxiety, depression, strong fam hx of CAD, recent onset of upper and lower extremity edema who was admitted with substernal CP and dyspnea from PCP's office.  Assessment & Plan    1. Chest pain/dyspnea on exertion - ruled out for MI, 2D echo reassuring with normal LV function. She received her beta blocker this morning. Will discuss plan for ischemic testing with MD given her risk factors - consider outpatient stress test. WIth family history, Dr. Rennis Golden recommended statin initiation. Will add baseline LFTs to labs. If the patient is tolerating statin at time of follow-up appointment, would consider rechecking liver function/lipid panel in 6-8 weeks.  2. Upper and lower extremity edema and mild pulm vascular congestion on CXR 01/15/17 - resolved on higher dose of Lasix  daily. She reports chronic L>RLE ever since birth of her last child over 20 years ago. No recent travel, surgery, bedrest. Not tachycardic, tachypneic and hypoxic. Continue Lasix at present dose given that she feels it has resolved her symptoms back to baseline. 2D echo reassuring with normal EF and normal diastolic parameters. Note at some point last month she was on amlodipine but this got stopped between two office visits. Do not see overt reference to stopping but patient reports she was taken off of this sometime last month. This could also have been contributing to exacerbation of edema. Would avoid in the future.   3. HTN - follow on current regimen.  4. Hypokalemia - give KCl x 2  today then start daily tomorrow. Received yesterday.  5. Anemia - will need further attention to this on f/u with PCP. No reported bleeding.  Dispo: await MD input. Will also need input on whether she should hold weight loss drug at DC pending further cardiac w/u.  Signed, Ronie Spies PA-C (pager [phone]) 01/17/2017, 10:44 AM

## 2017-01-17 NOTE — Discharge Instructions (Signed)
° °  You have a Stress Test scheduled at [facility]. Your doctor has ordered this test to check the blood flow in your heart arteries. ° °Please arrive 15 minutes early for paperwork. The whole test will take several hours. You may want to bring reading material to remain occupied while undergoing different parts of the test. ° °Instructions: °· No food/drink after midnight the night before. °· It is OK to take your morning meds with a sip of water EXCEPT for those types of medicines listed below or otherwise instructed. °· No caffeine/decaf products 24 hours before, including medicines such as Excedrin or Goody Powders. Call if there are any questions.  °· Wear comfortable clothes and shoes.  ° °Special Medication Instructions: °· Beta blockers such as metoprolol (Lopressor/Toprol XL), atenolol (Tenormin), carvedilol (Coreg), nebivolol (Bystolic), bisoprolol (Zebeta), propranolol (Inderal) should not be taken for 24 hours before the test. °· Calcium channel blockers such as diltiazem (Cardizem) or verapmil (Calan) should not be taken for 24 hours before the test. °· Remove nitroglycerin patches and do not take nitrate preparations such as Imdur/isosorbide the day of your test. °· No Persantine/Theophylline or Aggrenox medicines should be used within 24 hours of the test.  °· If you are diabetic, please ask which medications to hold the day of the test ° °What To Expect: °When you arrive in the lab, the technician will inject a small amount of radioactive tracer into your arm through an IV while you are resting quietly. This helps us to form pictures of your heart. You will likely only feel a sting from the IV. After a waiting period, resting pictures will be obtained under a big camera. These are the "before" pictures. ° °Next, you will be prepped for the stress portion of the test. This may include either walking on a treadmill or receiving a medicine that helps to dilate blood vessels in  your heart to simulate the effect of exercise on your heart. If you are walking on a treadmill, you will walk at different paces to try to get your heart rate to a goal number that is based on your age. If your doctor has chosen the pharmacologic test, then you will receive a medicine through your IV that may cause temporary nausea, flushing, shortness of breath and sometimes chest discomfort or vomiting. This is typically short-lived and usually resolves quickly. If you experience symptoms, that does not automatically mean the test is abnormal. Some patients do not experience any symptoms at all. Your blood pressure and heart rate will be monitored, and we will be watching your EKG on a computer screen for any changes. During this portion of the test, the radiologist will inject another small amount of radioactive tracer into your IV. After a waiting period, you will undergo a second set of pictures. These are the "after" pictures. ° °The doctor reading the test will compare the before-and-after images to look for evidence of heart blockages or heart weakness. The test usually takes 1 day to complete, but in certain instances (for example, if a patient is over a certain weight limit), the test may be done over the span of 2 days. ° ° °

## 2017-01-20 ENCOUNTER — Encounter: Payer: Self-pay | Admitting: *Deleted

## 2017-01-21 NOTE — Telephone Encounter (Signed)
User: Trina Ao A Date/time: 01/21/17 1:17 PM  Comment: Called pt and lmsg for her to CB to get added for her 2nd for nuclear study.  Context:  Outcome: Left Message  Phone number: [phone] Phone Type: Home Phone  Comm. type: Telephone Call type: Outgoing  Contact: [name] Relation to patient: Self    User: Trina Ao A Date/time: 01/20/17 3:39 PM  Comment: Called pt and lmsg for her to CB to schedule her second day that is needed for her myoivew..her weight is 232 lbs  Context:  Outcome: Left Message  Phone number: [phone] Phone Type: Home Phone  Comm. type: Telephone Call type: Outgoing  Contact: [name] Relation to patient: Self    User: Trina Ao A Date/time: 01/17/17 1:11 PM  Comment: Called pt and lmsg for her to CB in regard to stress test sch on 10/12.   Context:     Phone number: [phone] Phone Type: Home Phone  Comm. type: Telephone Call type: Outgoing  Contact: [name] Relation to patient: Self

## 2017-01-24 ENCOUNTER — Inpatient Hospital Stay (HOSPITAL_COMMUNITY): Admission: RE | Admit: 2017-01-24 | Payer: BLUE CROSS/BLUE SHIELD | Source: Ambulatory Visit

## 2017-01-24 ENCOUNTER — Encounter (HOSPITAL_COMMUNITY): Payer: BLUE CROSS/BLUE SHIELD

## 2017-01-28 ENCOUNTER — Telehealth (HOSPITAL_COMMUNITY): Payer: Self-pay

## 2017-01-28 ENCOUNTER — Ambulatory Visit (HOSPITAL_COMMUNITY): Payer: BLUE CROSS/BLUE SHIELD

## 2017-01-28 ENCOUNTER — Ambulatory Visit: Payer: BLUE CROSS/BLUE SHIELD | Admitting: Physician Assistant

## 2017-01-28 NOTE — Telephone Encounter (Signed)
Encounter complete. 

## 2017-01-29 ENCOUNTER — Telehealth (HOSPITAL_COMMUNITY): Payer: Self-pay

## 2017-01-29 NOTE — Telephone Encounter (Signed)
Encounter complete. 

## 2017-01-30 ENCOUNTER — Ambulatory Visit (HOSPITAL_COMMUNITY)
Admission: RE | Admit: 2017-01-30 | Discharge: 2017-01-30 | Disposition: A | Discharge status: Home / Self Care | Payer: BLUE CROSS/BLUE SHIELD | Source: Ambulatory Visit | Attending: Cardiology | Admitting: Cardiology

## 2017-01-30 DIAGNOSIS — Z6841 Body Mass Index (BMI) 40.0 and over, adult: Secondary | ICD-10-CM | POA: Insufficient documentation

## 2017-01-30 DIAGNOSIS — E876 Hypokalemia: Secondary | ICD-10-CM | POA: Insufficient documentation

## 2017-01-30 DIAGNOSIS — I10 Essential (primary) hypertension: Secondary | ICD-10-CM | POA: Diagnosis not present

## 2017-01-30 DIAGNOSIS — R0609 Other forms of dyspnea: Secondary | ICD-10-CM | POA: Insufficient documentation

## 2017-01-30 DIAGNOSIS — R42 Dizziness and giddiness: Secondary | ICD-10-CM | POA: Insufficient documentation

## 2017-01-30 DIAGNOSIS — R079 Chest pain, unspecified: Secondary | ICD-10-CM | POA: Diagnosis not present

## 2017-01-30 DIAGNOSIS — R9439 Abnormal result of other cardiovascular function study: Secondary | ICD-10-CM | POA: Diagnosis not present

## 2017-01-30 DIAGNOSIS — R5383 Other fatigue: Secondary | ICD-10-CM | POA: Insufficient documentation

## 2017-01-30 DIAGNOSIS — Z87891 Personal history of nicotine dependence: Secondary | ICD-10-CM | POA: Diagnosis not present

## 2017-01-30 DIAGNOSIS — E669 Obesity, unspecified: Secondary | ICD-10-CM | POA: Diagnosis not present

## 2017-01-30 DIAGNOSIS — Z8249 Family history of ischemic heart disease and other diseases of the circulatory system: Secondary | ICD-10-CM | POA: Diagnosis not present

## 2017-01-30 MED ORDER — REGADENOSON 0.4 MG/5ML IV SOLN
0.4000 mg | Freq: Once | INTRAVENOUS | Status: AC
  Administered 2017-01-30: 0.4 mg via INTRAVENOUS

## 2017-01-30 MED ORDER — TECHNETIUM TC 99M TETROFOSMIN IV KIT
30.5000 | PACK | Freq: Once | INTRAVENOUS | Status: AC | PRN
  Administered 2017-01-30: 30.5 via INTRAVENOUS
  Filled 2017-01-30: qty 31

## 2017-01-31 ENCOUNTER — Ambulatory Visit (HOSPITAL_COMMUNITY)
Admission: RE | Admit: 2017-01-31 | Discharge: 2017-01-31 | Disposition: A | Discharge status: Home / Self Care | Payer: BLUE CROSS/BLUE SHIELD | Source: Ambulatory Visit | Attending: Cardiovascular Disease | Admitting: Cardiovascular Disease

## 2017-01-31 LAB — MYOCARDIAL PERFUSION IMAGING
CHL CUP NUCLEAR SSS: 16
CSEPPHR: 99 {beats}/min
LV dias vol: 93 mL (ref 46–106)
LVSYSVOL: 30 mL
NUC STRESS TID: 0.77
Rest HR: 55 {beats}/min
SDS: 14
SRS: 2

## 2017-01-31 MED ORDER — TECHNETIUM TC 99M TETROFOSMIN IV KIT
30.2000 | PACK | Freq: Once | INTRAVENOUS | Status: AC | PRN
  Administered 2017-01-31: 30.2 via INTRAVENOUS

## 2017-02-04 ENCOUNTER — Ambulatory Visit (INDEPENDENT_AMBULATORY_CARE_PROVIDER_SITE_OTHER): Payer: BLUE CROSS/BLUE SHIELD | Admitting: Internal Medicine

## 2017-02-04 ENCOUNTER — Encounter: Payer: Self-pay | Admitting: Internal Medicine

## 2017-02-04 ENCOUNTER — Ambulatory Visit (INDEPENDENT_AMBULATORY_CARE_PROVIDER_SITE_OTHER): Payer: BLUE CROSS/BLUE SHIELD | Admitting: Physician Assistant

## 2017-02-04 ENCOUNTER — Encounter: Payer: Self-pay | Admitting: Physician Assistant

## 2017-02-04 VITALS — BP 110/80 | HR 64 | Temp 97.8°F | Ht 61.0 in | Wt 235.0 lb

## 2017-02-04 VITALS — BP 120/62 | HR 69 | Resp 16 | Ht 61.0 in | Wt 237.1 lb

## 2017-02-04 DIAGNOSIS — E785 Hyperlipidemia, unspecified: Secondary | ICD-10-CM

## 2017-02-04 DIAGNOSIS — F439 Reaction to severe stress, unspecified: Secondary | ICD-10-CM | POA: Diagnosis not present

## 2017-02-04 DIAGNOSIS — Z8249 Family history of ischemic heart disease and other diseases of the circulatory system: Secondary | ICD-10-CM

## 2017-02-04 DIAGNOSIS — E876 Hypokalemia: Secondary | ICD-10-CM

## 2017-02-04 DIAGNOSIS — R609 Edema, unspecified: Secondary | ICD-10-CM | POA: Diagnosis not present

## 2017-02-04 DIAGNOSIS — I10 Essential (primary) hypertension: Secondary | ICD-10-CM

## 2017-02-04 DIAGNOSIS — R079 Chest pain, unspecified: Secondary | ICD-10-CM | POA: Diagnosis not present

## 2017-02-04 DIAGNOSIS — R600 Localized edema: Secondary | ICD-10-CM

## 2017-02-04 DIAGNOSIS — Z79899 Other long term (current) drug therapy: Secondary | ICD-10-CM | POA: Diagnosis not present

## 2017-02-04 LAB — BASIC METABOLIC PANEL WITH GFR
BUN: 10 mg/dL (ref 7–25)
CALCIUM: 9 mg/dL (ref 8.6–10.4)
CHLORIDE: 103 mmol/L (ref 98–110)
CO2: 32 mmol/L (ref 20–32)
CREATININE: 0.71 mg/dL (ref 0.50–1.05)
GFR, Est African American: 115 mL/min/{1.73_m2} (ref >=60)
GFR, Est Non African American: 99 mL/min/{1.73_m2} (ref >=60)
GLUCOSE: 109 mg/dL — AB (ref 65–99)
Potassium: 4.3 mmol/L (ref 3.5–5.3)
Sodium: 141 mmol/L (ref 135–146)

## 2017-02-04 NOTE — Progress Notes (Signed)
Cardiology Office Note    Date:  02/04/2017   ID:  Andrea Thompson, DOB 10-Oct-1965, MRN [id number]  PCP:  Margaree Mackintosh, MD  Cardiologist:  Dr. Rennis Golden (previously Dr. Shirlee Latch)  Chief Complaint  Patient presents with  . Hospitalization Follow-up    seen for Dr. Rennis Golden    History of Present Illness:  Andrea Thompson is a 51 y.o. female with PMH of HTN, GERD, anxiety, depression who was recently evaluated for chest pain. She was previously seen in 2012 for bigeminy, palpitation and chest pain. She supposedly had a Holter monitor ordered, however no record was located in [place]. Recently, she presented to the hospital on 01/16/2017 with complaint of substernal pain and also increasing lower extremity edema. On initial arrival, her potassium was 3.2, BNP 136, serial troponin negative. Hgb A1C 5.1. Fasting lipid panel obtained on 01/17/2017 showed total cholesterol 147, HDL 29, LDL 93, triglyceride 124. Chest x-ray showed no active cardiopulmonary disease. Echocardiogram obtained on 01/17/2017 showed EF 55-60%. Outpatient Myoview obtained on 01/31/2017 showed EF 60%, medium defect of moderate severity present in the mid anterior, apical anterior, apex location both at rest and with exertion, this was felt to be likely breast attenuation, overall considered low risk finding.  She presents today for cardiology office visit, she has not had any further chest discomfort since the recent hospital admission. She was able to exercise on her elliptical 4 days ago for up to 30 minutes without any exertional chest discomfort or shortness of breath. She does have occasional leg cramps, with the recent low potassium level, she will need a basic metabolic panel at her PCPs office today. HDL was very low, I recommended increase activity level, she will need a repeat fasting lipid panel and liver function test in 8 weeks. Given atypical symptom and negative workup, she can see Dr. Rennis Golden in a year, earlier if needed.  Otherwise, she continued to have trace amount of lower extremity edema, left worse than right chronically. Otherwise her lung is clear, she denies any orthopnea or PND. I will continue her on the current dose of Lasix.    Past Medical History:  Diagnosis Date  . Allergy    allergic rhinitis  . Anxiety   . Depression   . GERD (gastroesophageal reflux disease)   . Hypertension   . Migraine headache   . Vitamin D deficiency     Past Surgical History:  Procedure Laterality Date  . ABDOMINAL HYSTERECTOMY      Current Medications: Outpatient Medications Prior to Visit  Medication Sig Dispense Refill  . aspirin EC 81 MG EC tablet Take 1 tablet (81 mg total) by mouth daily. 90 tablet 3  . atorvastatin (LIPITOR) 40 MG tablet Take 1 tablet (40 mg total) by mouth every evening. 30 tablet 3  . Diethylpropion HCl CR 75 MG TB24 Take 1 tablet (75 mg total) by mouth daily. 30 tablet 0  . furosemide (LASIX) 40 MG tablet Take 1 tablet (40 mg total) by mouth daily. 30 tablet 3  . magnesium oxide (MAG-OX) 400 (241.3 Mg) MG tablet Take 1 tablet (400 mg total) by mouth daily. 30 tablet 2  . nebivolol (BYSTOLIC) 10 MG tablet Bystolic 10 mg tablet    . pantoprazole (PROTONIX) 40 MG tablet TAKE 1 TABLET (40 MG TOTAL) BY MOUTH DAILY. 90 tablet 3  . potassium chloride SA (K-DUR,KLOR-CON) 20 MEQ tablet Take 2 tablets (40 mEq total) by mouth daily. 60 tablet 3  . sertraline (ZOLOFT)  100 MG tablet TAKE 1 TABLET (100 MG TOTAL) BY MOUTH DAILY. 90 tablet 1   No facility-administered medications prior to visit.      Allergies:   Penicillins; Imitrex [sumatriptan base]; and Cinnamon   Social History   Social History  . Marital status: Married    Spouse name: N/A  . Number of children: N/A  . Years of education: N/A   Social History Main Topics  . Smoking status: Former Smoker    Packs/day: 1.00    Types: Cigarettes    Quit date: 10/01/2005  . Smokeless tobacco: Never Used  . Alcohol use No  . Drug  use: No  . Sexual activity: Not Asked   Other Topics Concern  . None   Social History Narrative  . None     Family History:  The patient's family history includes Cancer in her father; Heart disease in her mother.   ROS:   Please see the history of present illness.    ROS All other systems reviewed and are negative.   PHYSICAL EXAM:   VS:  BP 120/62   Pulse 69   Resp 16   Ht 5\' 1"  (1.549 m)   Wt 237 lb 2 oz (107.6 kg)   SpO2 97%   BMI 44.80 kg/m    GEN: Well nourished, well developed, in no acute distress  HEENT: normal  Neck: no JVD, carotid bruits, or masses Cardiac: RRR; no murmurs, rubs, or gallops. Trace edema, L >R  Respiratory:  clear to auscultation bilaterally, normal work of breathing GI: soft, nontender, nondistended, + BS MS: no deformity or atrophy  Skin: warm and dry, no rash Neuro:  Alert and Oriented x 3, Strength and sensation are intact Psych: euthymic mood, full affect  Wt Readings from Last 3 Encounters:  02/04/17 237 lb 2 oz (107.6 kg)  01/30/17 232 lb (105.2 kg)  01/17/17 232 lb 3.2 oz (105.3 kg)      Studies/Labs Reviewed:   EKG:  EKG is not ordered today.    Recent Labs: 06/21/2016: TSH 3.08 01/17/2017: ALT 19; B Natriuretic Peptide 131.5; BUN 9; Creatinine, Ser 0.60; Hemoglobin 11.0; Magnesium 1.7; Platelets 233; Potassium 3.2; Sodium 136   Lipid Panel    Component Value Date/Time   CHOL 147 01/17/2017 0522   TRIG 124 01/17/2017 0522   HDL 29 (L) 01/17/2017 0522   CHOLHDL 5.1 01/17/2017 0522   VLDL 25 01/17/2017 0522   LDLCALC 93 01/17/2017 0522    Additional studies/ records that were reviewed today include:   Echo 01/17/2017 LV EF: 55% -   60%  Study Conclusions  - Left ventricle: The cavity size was normal. There was moderate   focal basal hypertrophy. Systolic function was normal. The   estimated ejection fraction was in the range of 55% to 60%. Wall   motion was normal; there were no regional wall motion    abnormalities. Left ventricular diastolic function parameters   were normal. - Mitral valve: Valve area by pressure half-time: 1.8 cm^2.    Myoview 01/31/2017 Study Highlights     The left ventricular ejection fraction is hyperdynamic (>65%).  Nuclear stress EF: 68%.  There was no ST segment deviation noted during stress.  Defect 1: There is a medium defect of moderate severity present in the mid anterior, apical anterior and apex location.  This is a low risk study.   Low risk stress nuclear study with probable breast attenuation; no ischemia; EF 68 with normal wall motion.  ASSESSMENT:    1. Chest pain, unspecified type   2. Encounter for long-term (current) use of medications   3. Hyperlipidemia LDL goal <70   4. Essential hypertension   5. Hypokalemia   6. Bilateral lower extremity edema      PLAN:  In order of problems listed above:  1. Chest pain: Resolved since previous admission. Nature of chest pain was atypical. She had normal echocardiogram and a low risk stress test. Since recent discharge, she has been able to exercise for up to 30 minutes on elliptical without any recurrent exertional symptom. No further workup is needed. She will need to control her risk factors including hypertension and hyperlipidemia. Weight loss is also encouraged.  2. Hypokalemia: Seen on recent admission with potassium level 3.2. Will need a basic metabolic panel today at her PCPs office. This does not need to be fasting as she just had a recent hemoglobin A1c of 5.1.  3. Hyperlipidemia: She will need a fasting lipid panel and LFT in 8 weeks. Increase activity level during the meantime. She does have leg cramps, this does not necessarily means reaction was Lipitor. She will continue the Lipitor for now. Will check electrolyte in today's basic metabolic panel. If worsens, may consider switching to another statin  4. Hypertension: On nebivolol, blood pressure  well-controlled.    Medication Adjustments/Labs and Tests Ordered: Current medicines are reviewed at length with the patient today.  Concerns regarding medicines are outlined above.  Medication changes, Labs and Tests ordered today are listed in the Patient Instructions below. Patient Instructions  Medication Instructions:   No changes to medications - increase your activity as tolerated.  Labwork:   BMET today at primary care office  In 8 weeks, have fasting labwork (cholesterol and liver function test) at primary care.   Testing/Procedures:  none  Follow-Up:  Your physician recommends that you schedule a follow-up appointment in: 1 year with Dr. Rennis GoldenHilty. We will send you a reminder letter when it is time to schedule this appointment.   If you need a refill on your cardiac medications before your next appointment, please call your pharmacy.      Ramond DialSigned, Corena Tilson, [place]  02/04/2017 9:32 AM    [facility] [address], [place], [place]  [address] Phone: [phone]; Fax: [phone]

## 2017-02-04 NOTE — Patient Instructions (Signed)
Continue medications as previously prescribed.  Return in 8 weeks for fasting lipid panel liver functions and office visit.  Basic metabolic panel drawn today.

## 2017-02-04 NOTE — Patient Instructions (Addendum)
Medication Instructions:   No changes to medications - increase your activity as tolerated.  Labwork:   BMET today at primary care office  In 8 weeks, have fasting labwork (cholesterol and liver function test) at primary care.   Testing/Procedures:  none  Follow-Up:  Your physician recommends that you schedule a follow-up appointment in: 1 year with Dr. Rennis GoldenHilty. We will send you a reminder letter when it is time to schedule this appointment.   If you need a refill on your cardiac medications before your next appointment, please call your pharmacy.

## 2017-02-04 NOTE — Progress Notes (Signed)
   Subjective:    Patient ID: Andrea HertzMarsha M Thompson, female    DOB: 07/23/1965, 51 y.o.   MRN: [id number]  HPI She was seen here October 4 complaining of chest pain.  She was sent to the Emergency Department and was subsequently admitted to rule out MI. Troponin studies were negative. Lipid panel revealed low HDL of 29.  BNP was elevated at 131.5 and she had significant dependent edema.  She is now on Lasix.  Potassium on admission was 3.2.  She is here today for follow-up on recent hospitalization and to obtain basic metabolic panel.  She is now on Lasix 40 mg daily.  Remains on Bystolic 10 mg daily and is now on potassium supplement enteric-coated baby aspirin daily.  2040 mEq daily.  Continues to take generic Protonix and is now on enteric-coated baby aspirin daily. She feels better.  Still has stress with her mother who is in the nursing talk with her today about setting limits for the good of her own health.  Review of Systems     Objective:   Physical Exam Skin warm and dry.  Nodes none.  Neck is supple without JVD thyromegaly or carotid bruits.  Chest clear to auscultation; Cardiac exam regular rate and rhythm without murmurs or gallops.  Lower extremity edema improved.       Assessment & Plan:  Atypical chest pain-MI ruled out  Dependent edema  Hypokalemia-recheck potassium today.  Continue Lasix and potassium supplement  Plan: In 8 weeks she should have lipid panel liver functions and follow-up here now that she is on Lipitor 40 mg daily.

## 2017-03-20 ENCOUNTER — Other Ambulatory Visit: Payer: Self-pay | Admitting: Internal Medicine

## 2017-03-20 DIAGNOSIS — E785 Hyperlipidemia, unspecified: Secondary | ICD-10-CM

## 2017-03-20 DIAGNOSIS — Z79899 Other long term (current) drug therapy: Secondary | ICD-10-CM

## 2017-03-25 ENCOUNTER — Other Ambulatory Visit: Payer: Self-pay | Admitting: Physician Assistant

## 2017-03-26 DIAGNOSIS — J209 Acute bronchitis, unspecified: Secondary | ICD-10-CM | POA: Diagnosis not present

## 2017-04-01 ENCOUNTER — Other Ambulatory Visit (INDEPENDENT_AMBULATORY_CARE_PROVIDER_SITE_OTHER): Payer: BLUE CROSS/BLUE SHIELD | Admitting: Internal Medicine

## 2017-04-01 DIAGNOSIS — E785 Hyperlipidemia, unspecified: Secondary | ICD-10-CM

## 2017-04-01 DIAGNOSIS — I10 Essential (primary) hypertension: Secondary | ICD-10-CM

## 2017-04-01 DIAGNOSIS — Z79899 Other long term (current) drug therapy: Secondary | ICD-10-CM

## 2017-04-01 LAB — HEPATIC FUNCTION PANEL
AG RATIO: 1.4 (calc) (ref 1.0–2.5)
ALT: 11 U/L (ref 6–29)
AST: 11 U/L (ref 10–35)
Albumin: 3.9 g/dL (ref 3.6–5.1)
Alkaline phosphatase (APISO): 78 U/L (ref 33–130)
BILIRUBIN DIRECT: 0.1 mg/dL (ref 0.0–0.2)
BILIRUBIN INDIRECT: 0.3 mg/dL (ref 0.2–1.2)
GLOBULIN: 2.7 g/dL (ref 1.9–3.7)
Total Bilirubin: 0.4 mg/dL (ref 0.2–1.2)
Total Protein: 6.6 g/dL (ref 6.1–8.1)

## 2017-04-01 LAB — LIPID PANEL
CHOL/HDL RATIO: 3.9 (calc) (ref <5.0)
Cholesterol: 125 mg/dL (ref <200)
HDL: 32 mg/dL — ABNORMAL LOW (ref >50)
LDL Cholesterol (Calc): 74 mg/dL (calc)
NON-HDL CHOLESTEROL (CALC): 93 mg/dL (ref <130)
Triglycerides: 107 mg/dL (ref <150)

## 2017-04-01 NOTE — Addendum Note (Signed)
Addended by: Reka Wist P on: 04/01/2017 09:16 AM   Modules accepted: Orders  

## 2017-04-01 NOTE — Addendum Note (Signed)
Addended by: Gregery NaVALENCIA, Dontez Hauss P on: 04/01/2017 09:16 AM   Modules accepted: Orders

## 2017-04-04 ENCOUNTER — Ambulatory Visit (INDEPENDENT_AMBULATORY_CARE_PROVIDER_SITE_OTHER): Payer: BLUE CROSS/BLUE SHIELD | Admitting: Internal Medicine

## 2017-04-04 ENCOUNTER — Ambulatory Visit
Admission: RE | Admit: 2017-04-04 | Discharge: 2017-04-04 | Disposition: A | Discharge status: Home / Self Care | Payer: BLUE CROSS/BLUE SHIELD | Source: Ambulatory Visit | Attending: Internal Medicine | Admitting: Internal Medicine

## 2017-04-04 ENCOUNTER — Encounter: Payer: Self-pay | Admitting: Internal Medicine

## 2017-04-04 VITALS — BP 160/92 | HR 71 | Ht 61.0 in | Wt 233.0 lb

## 2017-04-04 DIAGNOSIS — J22 Unspecified acute lower respiratory infection: Secondary | ICD-10-CM | POA: Diagnosis not present

## 2017-04-04 DIAGNOSIS — I10 Essential (primary) hypertension: Secondary | ICD-10-CM | POA: Diagnosis not present

## 2017-04-04 DIAGNOSIS — R059 Cough, unspecified: Secondary | ICD-10-CM

## 2017-04-04 DIAGNOSIS — J9801 Acute bronchospasm: Secondary | ICD-10-CM

## 2017-04-04 DIAGNOSIS — R05 Cough: Secondary | ICD-10-CM | POA: Diagnosis not present

## 2017-04-04 MED ORDER — PREDNISONE 10 MG PO TABS: ORAL_TABLET | ORAL | 0 refills | Status: DC

## 2017-04-04 MED ORDER — LEVOFLOXACIN 500 MG PO TABS: 500.0000 mg | ORAL_TABLET | Freq: Every day | ORAL | 0 refills | Status: DC

## 2017-04-04 MED ORDER — HYDROCODONE-HOMATROPINE 5-1.5 MG/5ML PO SYRP: 5.0000 mL | ORAL_SOLUTION | Freq: Three times a day (TID) | ORAL | 0 refills | Status: DC | PRN

## 2017-04-04 MED ORDER — ALBUTEROL SULFATE HFA 108 (90 BASE) MCG/ACT IN AERS: 2.0000 | INHALATION_SPRAY | Freq: Four times a day (QID) | RESPIRATORY_TRACT | 0 refills | Status: DC | PRN

## 2017-04-04 MED ORDER — FLUCONAZOLE 150 MG PO TABS: 150.0000 mg | ORAL_TABLET | Freq: Once | ORAL | 1 refills | Status: AC

## 2017-04-04 MED ORDER — CEFTRIAXONE SODIUM 1 G IJ SOLR
1.0000 g | Freq: Once | INTRAMUSCULAR | Status: AC
  Administered 2017-04-04: 1 g via INTRAMUSCULAR

## 2017-04-04 NOTE — Patient Instructions (Signed)
Take prednisone and tapering course.  Levaquin 500 mg daily for 10 days.  Albuterol inhaler 2 sprays 4 times daily.  Hycodan sparingly for cough.  Rest and drink plenty of fluids.  Diflucan if needed for Candida vaginitis.  Return in early January for follow-up on hypertension.

## 2017-04-04 NOTE — Progress Notes (Signed)
   Subjective:    Patient ID: Andrea Thompson, female    DOB: 06/22/1965, 51 y.o.   MRN: [id number]  HPI She is here today to follow-up on blood pressure issues.  Her blood pressure is elevated 160/92 and she has been coughing for 2 weeks.  She was seen in urgent care a couple of weeks ago and was treated with a steroid shot and Z-Pak but has continued to cough.  No fever or shaking chills.  Little sputum production.  No frank wheezing.  Cannot stop coughing.  Has been using cough drops.    Review of Systems see above-situational stress with mother and nursing home.  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  Says flu test at urgent care was negative.     Objective:   Physical Exam Skin warm and dry.  Nodes none.  TMs are clear.  Pharynx is clear.  Neck is supple.  Chest  Is clear to auscultation without wheezing or frank rales.  Coughing continuously in the office today.       Assessment & Plan:  Acute bronchospasm  Acute bronchitis  Essential hypertension  Situational stress  Plan: Rocephin 1 g IM.  Levaquin 500 mg daily for 10 days.  Chest x-ray.  Albuterol inhaler 2 sprays p.o. 4 times daily.  Hycodan 1 teaspoon p.o. every 6-8 hours as needed cough.  Tapering course of prednisone 10 mg going from 60 mg to 0 mg over 7 days.  Her blood pressure is elevated today.  She is not feeling well.  She will return in early January for follow-up bone blood pressure when she is over this acute illness.  She is going to have a chest x-ray.  Rest and drink plenty of fluids.

## 2017-04-04 NOTE — Addendum Note (Signed)
Addended by: Gregery NaVALENCIA, Juquan Reznick P on: 04/04/2017 10:40 AM   Modules accepted: Orders

## 2017-04-26 ENCOUNTER — Other Ambulatory Visit: Payer: Self-pay | Admitting: Internal Medicine

## 2017-05-01 ENCOUNTER — Ambulatory Visit (INDEPENDENT_AMBULATORY_CARE_PROVIDER_SITE_OTHER): Payer: BLUE CROSS/BLUE SHIELD | Admitting: Internal Medicine

## 2017-05-01 ENCOUNTER — Encounter: Payer: Self-pay | Admitting: Internal Medicine

## 2017-05-01 VITALS — BP 140/80 | HR 62 | Ht 61.0 in | Wt 230.0 lb

## 2017-05-01 DIAGNOSIS — F439 Reaction to severe stress, unspecified: Secondary | ICD-10-CM | POA: Diagnosis not present

## 2017-05-01 DIAGNOSIS — Z6841 Body Mass Index (BMI) 40.0 and over, adult: Secondary | ICD-10-CM

## 2017-05-01 DIAGNOSIS — I10 Essential (primary) hypertension: Secondary | ICD-10-CM

## 2017-05-01 NOTE — Progress Notes (Signed)
   Subjective:    Patient ID: Andrea Thompson, female    DOB: 12/19/1965, 52 y.o.   MRN: [id number]  HPI 52 year old Female in today to follow-up on hypertension.  Remains on Bystolic 10 mg daily and samples provided today.  She is not been taking Tenuate.  Needs to get back into the gym and start diet and exercise.  Has started to set some limits with her mother in the nursing home.  No further bouts of chest pain.  Is on Lasix 40 mg daily and Lipitor 40 mg daily.  Needs to continue potassium supplementation.  Remains on Zoloft for anxiety and depression regarding situational stress with her mother   Review of Systems     Objective:   Physical Exam  Blood pressure is 140/80 and BMI is 43.46.  She needs to continue to monitor at home and let me know if persistently elevated.      Assessment & Plan:  Elevated blood pressure reading here in office?  Office hypertension.  She needs to monitor at home and let me know if persistently elevated.  BMI 43.46-needs to find time to exercise and start dieting.  Has been on Tenuate in the past for appetite suppression  Situational stress with mother and nursing home  Plan: Physical exam due after June 21, 2017.

## 2017-05-15 ENCOUNTER — Other Ambulatory Visit: Payer: Self-pay | Admitting: Internal Medicine

## 2017-05-15 NOTE — Telephone Encounter (Signed)
Please call these in for 90 days. Tenuate cannot be e-scribed

## 2017-05-15 NOTE — Patient Instructions (Signed)
Continue current medications.  Please try to diet exercise and lose some weight.  Call if blood pressure persistently elevated.  Physical exam due in late March.

## 2017-06-05 ENCOUNTER — Other Ambulatory Visit: Payer: Self-pay | Admitting: Internal Medicine

## 2017-06-06 ENCOUNTER — Encounter: Payer: Self-pay | Admitting: Internal Medicine

## 2017-06-10 ENCOUNTER — Other Ambulatory Visit: Payer: Self-pay | Admitting: Internal Medicine

## 2017-06-22 ENCOUNTER — Other Ambulatory Visit: Payer: Self-pay | Admitting: Physician Assistant

## 2017-07-04 ENCOUNTER — Other Ambulatory Visit: Payer: Self-pay

## 2017-07-04 MED ORDER — HYDROCHLOROTHIAZIDE 25 MG PO TABS: 25.0000 mg | ORAL_TABLET | Freq: Every day | ORAL | 3 refills | Status: DC

## 2017-07-05 ENCOUNTER — Other Ambulatory Visit: Payer: Self-pay | Admitting: Internal Medicine

## 2017-07-10 ENCOUNTER — Other Ambulatory Visit: Payer: Self-pay | Admitting: Internal Medicine

## 2017-07-10 DIAGNOSIS — Z0000 Encounter for general adult medical examination without abnormal findings: Secondary | ICD-10-CM

## 2017-07-10 DIAGNOSIS — Z1321 Encounter for screening for nutritional disorder: Secondary | ICD-10-CM

## 2017-07-10 DIAGNOSIS — Z1329 Encounter for screening for other suspected endocrine disorder: Secondary | ICD-10-CM

## 2017-07-10 DIAGNOSIS — Z13220 Encounter for screening for lipoid disorders: Secondary | ICD-10-CM

## 2017-07-10 DIAGNOSIS — I10 Essential (primary) hypertension: Secondary | ICD-10-CM

## 2017-07-10 DIAGNOSIS — M19041 Primary osteoarthritis, right hand: Secondary | ICD-10-CM

## 2017-07-10 DIAGNOSIS — M19042 Primary osteoarthritis, left hand: Secondary | ICD-10-CM

## 2017-07-25 ENCOUNTER — Encounter: Payer: BLUE CROSS/BLUE SHIELD | Admitting: Internal Medicine

## 2017-07-25 ENCOUNTER — Telehealth: Payer: Self-pay | Admitting: Internal Medicine

## 2017-07-25 ENCOUNTER — Other Ambulatory Visit: Payer: BLUE CROSS/BLUE SHIELD | Admitting: Internal Medicine

## 2017-07-25 ENCOUNTER — Encounter: Payer: Self-pay | Admitting: Internal Medicine

## 2017-07-25 NOTE — Telephone Encounter (Signed)
Missed CPE lab appt. CPE is Tuesday April 16 need labs before that appt. Left message for her to call back

## 2017-07-29 ENCOUNTER — Encounter: Payer: Self-pay | Admitting: Internal Medicine

## 2017-07-29 ENCOUNTER — Ambulatory Visit (INDEPENDENT_AMBULATORY_CARE_PROVIDER_SITE_OTHER): Payer: BLUE CROSS/BLUE SHIELD | Admitting: Internal Medicine

## 2017-07-29 VITALS — BP 110/70 | HR 68 | Ht 61.0 in | Wt 245.0 lb

## 2017-07-29 DIAGNOSIS — Z6841 Body Mass Index (BMI) 40.0 and over, adult: Secondary | ICD-10-CM

## 2017-07-29 DIAGNOSIS — Z13220 Encounter for screening for lipoid disorders: Secondary | ICD-10-CM

## 2017-07-29 DIAGNOSIS — R609 Edema, unspecified: Secondary | ICD-10-CM | POA: Diagnosis not present

## 2017-07-29 DIAGNOSIS — F419 Anxiety disorder, unspecified: Secondary | ICD-10-CM | POA: Diagnosis not present

## 2017-07-29 DIAGNOSIS — F4541 Pain disorder exclusively related to psychological factors: Secondary | ICD-10-CM

## 2017-07-29 DIAGNOSIS — F439 Reaction to severe stress, unspecified: Secondary | ICD-10-CM | POA: Diagnosis not present

## 2017-07-29 DIAGNOSIS — I10 Essential (primary) hypertension: Secondary | ICD-10-CM

## 2017-07-29 DIAGNOSIS — Z0000 Encounter for general adult medical examination without abnormal findings: Secondary | ICD-10-CM | POA: Diagnosis not present

## 2017-07-29 DIAGNOSIS — Z1329 Encounter for screening for other suspected endocrine disorder: Secondary | ICD-10-CM

## 2017-07-29 DIAGNOSIS — M19042 Primary osteoarthritis, left hand: Secondary | ICD-10-CM | POA: Diagnosis not present

## 2017-07-29 DIAGNOSIS — Z1321 Encounter for screening for nutritional disorder: Secondary | ICD-10-CM

## 2017-07-29 DIAGNOSIS — E785 Hyperlipidemia, unspecified: Secondary | ICD-10-CM | POA: Diagnosis not present

## 2017-07-29 DIAGNOSIS — M19041 Primary osteoarthritis, right hand: Secondary | ICD-10-CM | POA: Diagnosis not present

## 2017-07-29 LAB — POCT URINALYSIS DIPSTICK
APPEARANCE: NORMAL
BILIRUBIN UA: NEGATIVE
Blood, UA: NEGATIVE
GLUCOSE UA: NEGATIVE
Ketones, UA: NEGATIVE
Leukocytes, UA: NEGATIVE
Nitrite, UA: NEGATIVE
ODOR: NORMAL
Protein, UA: NEGATIVE
Spec Grav, UA: 1.015 (ref 1.010–1.025)
Urobilinogen, UA: 0.2 E.U./dL
pH, UA: 6 (ref 5.0–8.0)

## 2017-07-29 NOTE — Progress Notes (Signed)
Subjective:    Patient ID: Andrea Thompson, female    DOB: 06/04/1965, 52 y.o.   MRN: [id number]  HPI 52 year old Female for evaluation of medical issues and routine health maintenance exam.  She has a history of hypertension which sometimes is labile.  Bystolic 10 mg daily works well for her urine samples were provided today.  She is on Lasix 40 mg daily and Lipitor 40 mg daily.  She takes potassium supplement for hypokalemia and Zoloft for anxiety depression.  February 2016 she lost 33 pounds with diet and exercise.  Unfortunately she has had weight gain.  Has gained 15 pounds since January.  Mother is in a nursing home.  She requires a lot of attention.  Patient continues to work a full-time job [employer].  History of ventricular bigeminy and palpitations which are well controlled on Bystolic.  Her hypertension is long-standing.  In February 2012 she was found to have ventricular bigeminy during the evaluation in the emergency department for chest pain and palpitations.  She had a stress echocardiogram that was normal.  She was reassured that she was at low risk for coronary disease.  History of depression, anxiety, irritable bowel syndrome, migraine headaches, allergic rhinitis and GE reflux.  Also takes losartan HCTZ in addition to Bystolic.  History of hysterectomy without oophorectomy 2002 for menorrhagia.  Stop smoking in 2008.  Social history: She is married and has a GED degree.  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  Husband works for [employer].  Patient previously smoked a pack of cigarettes daily for some 18 years.  She has 1 son and 2 daughters.  Family history: Mother with history of coronary artery disease and angioplasty at age 52 and CABG at age 52.  Subsequently had MI at age 52.  Mother is debilitated and apparently has had a stroke now living in the nursing home but formally lived with patient.  One brother in good health.  Father died of lung cancer  July 2004.  Grandfather with history of CABG.    Review of Systems  Respiratory: Negative.   Cardiovascular: Negative.   Gastrointestinal: Negative.   Genitourinary: Negative.   Psychiatric/Behavioral:       Situational stress aggravating her depression.  She is frustrated with her weight       Objective:   Physical Exam  Constitutional: She is oriented to person, place, and time. She appears well-developed and well-nourished. No distress.  HENT:  Head: Normocephalic and atraumatic.  Right Ear: External ear normal.  Left Ear: External ear normal.  Mouth/Throat: Oropharynx is clear and moist.  Eyes: Pupils are equal, round, and reactive to light. Conjunctivae and EOM are normal. Right eye exhibits no discharge. Left eye exhibits no discharge. No scleral icterus.  Neck: Neck supple. No JVD present. No thyromegaly present.  Cardiovascular: Normal rate, normal heart sounds and intact distal pulses.  No murmur heard. Pulmonary/Chest: Effort normal and breath sounds normal. No respiratory distress. She has no wheezes. She has no rales.  Breasts normal female  Abdominal: Soft. Bowel sounds are normal. She exhibits no distension and no mass. There is no tenderness. There is no rebound and no guarding.  Genitourinary:  Genitourinary Comments: Pap taken of vaginal cuff 2017. Bimanual normal.  Musculoskeletal: She exhibits no edema.  Heberden's and Bouchard's nodes both hands  Lymphadenopathy:    She has no cervical adenopathy.  Neurological: She is alert and oriented to person, place, and time. She has  normal reflexes. No cranial nerve deficit. Coordination normal.  Skin: Skin is warm and dry. No rash noted. She is not diaphoretic.  Psychiatric: She has a normal mood and affect. Her behavior is normal. Judgment and thought content normal.  Vitals reviewed.         Assessment & Plan:  Essential hypertension-stable on current regimen  Weight gain-BMI 46.29  Bilateral hand  arthritis  Anxiety depression-continue Zoloft  Situational stress  Plan: Patient will continue to work on diet and exercise and weight loss.  She needs to find time for herself for this.  She needs to have better self-esteem.  She will continue with Zoloft and same antihypertensive medications.  Follow-up in 6 months.

## 2017-07-30 ENCOUNTER — Telehealth: Payer: Self-pay | Admitting: Internal Medicine

## 2017-07-30 LAB — CBC WITH DIFFERENTIAL/PLATELET
BASOS ABS: 63 {cells}/uL (ref 0–200)
Basophils Relative: 0.8 %
EOS PCT: 1.3 %
Eosinophils Absolute: 103 cells/uL (ref 15–500)
HCT: 38.2 % (ref 35.0–45.0)
Hemoglobin: 13 g/dL (ref 11.7–15.5)
Lymphs Abs: 2354 cells/uL (ref 850–3900)
MCH: 28.9 pg (ref 27.0–33.0)
MCHC: 34 g/dL (ref 32.0–36.0)
MCV: 84.9 fL (ref 80.0–100.0)
MONOS PCT: 5.3 %
MPV: 10.1 fL (ref 7.5–12.5)
NEUTROS ABS: 4961 {cells}/uL (ref 1500–7800)
NEUTROS PCT: 62.8 %
Platelets: 285 10*3/uL (ref 140–400)
RBC: 4.5 10*6/uL (ref 3.80–5.10)
RDW: 13.4 % (ref 11.0–15.0)
TOTAL LYMPHOCYTE: 29.8 %
WBC mixed population: 419 cells/uL (ref 200–950)
WBC: 7.9 10*3/uL (ref 3.8–10.8)

## 2017-07-30 LAB — COMPLETE METABOLIC PANEL WITH GFR
AG RATIO: 1.6 (calc) (ref 1.0–2.5)
ALKALINE PHOSPHATASE (APISO): 98 U/L (ref 33–130)
ALT: 15 U/L (ref 6–29)
AST: 11 U/L (ref 10–35)
Albumin: 4.2 g/dL (ref 3.6–5.1)
BUN: 12 mg/dL (ref 7–25)
CO2: 33 mmol/L — ABNORMAL HIGH (ref 20–32)
Calcium: 9.3 mg/dL (ref 8.6–10.4)
Chloride: 103 mmol/L (ref 98–110)
Creat: 0.7 mg/dL (ref 0.50–1.05)
GFR, EST NON AFRICAN AMERICAN: 100 mL/min/{1.73_m2} (ref >=60)
GFR, Est African American: 116 mL/min/{1.73_m2} (ref >=60)
GLOBULIN: 2.6 g/dL (ref 1.9–3.7)
Glucose, Bld: 101 mg/dL — ABNORMAL HIGH (ref 65–99)
POTASSIUM: 4.4 mmol/L (ref 3.5–5.3)
SODIUM: 142 mmol/L (ref 135–146)
Total Bilirubin: 0.4 mg/dL (ref 0.2–1.2)
Total Protein: 6.8 g/dL (ref 6.1–8.1)

## 2017-07-30 LAB — LIPID PANEL
CHOLESTEROL: 121 mg/dL (ref <200)
HDL: 39 mg/dL — AB (ref >50)
LDL CHOLESTEROL (CALC): 69 mg/dL
Non-HDL Cholesterol (Calc): 82 mg/dL (calc) (ref <130)
Total CHOL/HDL Ratio: 3.1 (calc) (ref <5.0)
Triglycerides: 54 mg/dL (ref <150)

## 2017-07-30 LAB — TSH: TSH: 1.8 mIU/L

## 2017-07-30 LAB — VITAMIN D 25 HYDROXY (VIT D DEFICIENCY, FRACTURES): VIT D 25 HYDROXY: 37 ng/mL (ref 30–100)

## 2017-07-30 NOTE — Telephone Encounter (Signed)
Mailed lab results on 07/30/17

## 2017-08-10 NOTE — Patient Instructions (Signed)
Please try to work on diet exercise and weight loss.  Continue same medications and follow-up in 6 months.

## 2017-08-19 ENCOUNTER — Other Ambulatory Visit: Payer: Self-pay | Admitting: Internal Medicine

## 2017-08-21 NOTE — Telephone Encounter (Signed)
Please call her. Lasix was temporary as she also takes HCTZ. Does she need Lasix every day  Cannot take both long term

## 2017-08-21 NOTE — Telephone Encounter (Signed)
Left message for patient to return call.

## 2017-08-22 ENCOUNTER — Other Ambulatory Visit: Payer: Self-pay

## 2017-08-22 NOTE — Addendum Note (Signed)
Addended by: Gregery Na on: 08/22/2017 10:49 AM   Modules accepted: Orders

## 2017-08-22 NOTE — Telephone Encounter (Signed)
Discontinue Lasix

## 2017-08-22 NOTE — Telephone Encounter (Signed)
Discontinue lasix.

## 2017-08-22 NOTE — Telephone Encounter (Signed)
Patient called back states she does not think she needs the lasix for now but she will continue to take the HCTZ.

## 2017-11-07 ENCOUNTER — Encounter (HOSPITAL_BASED_OUTPATIENT_CLINIC_OR_DEPARTMENT_OTHER): Payer: Self-pay | Admitting: Emergency Medicine

## 2017-11-07 ENCOUNTER — Emergency Department (HOSPITAL_BASED_OUTPATIENT_CLINIC_OR_DEPARTMENT_OTHER)
Admission: EM | Admit: 2017-11-07 | Discharge: 2017-11-07 | Disposition: A | Discharge status: Home / Self Care | Payer: BLUE CROSS/BLUE SHIELD | Attending: Emergency Medicine | Admitting: Emergency Medicine

## 2017-11-07 ENCOUNTER — Other Ambulatory Visit: Payer: Self-pay

## 2017-11-07 DIAGNOSIS — Z87891 Personal history of nicotine dependence: Secondary | ICD-10-CM | POA: Diagnosis not present

## 2017-11-07 DIAGNOSIS — Y999 Unspecified external cause status: Secondary | ICD-10-CM | POA: Diagnosis not present

## 2017-11-07 DIAGNOSIS — Z23 Encounter for immunization: Secondary | ICD-10-CM | POA: Insufficient documentation

## 2017-11-07 DIAGNOSIS — W540XXA Bitten by dog, initial encounter: Secondary | ICD-10-CM | POA: Diagnosis not present

## 2017-11-07 DIAGNOSIS — S81851A Open bite, right lower leg, initial encounter: Secondary | ICD-10-CM

## 2017-11-07 DIAGNOSIS — Y939 Activity, unspecified: Secondary | ICD-10-CM | POA: Diagnosis not present

## 2017-11-07 DIAGNOSIS — Z79899 Other long term (current) drug therapy: Secondary | ICD-10-CM | POA: Diagnosis not present

## 2017-11-07 DIAGNOSIS — S80871A Other superficial bite, right lower leg, initial encounter: Secondary | ICD-10-CM | POA: Diagnosis not present

## 2017-11-07 DIAGNOSIS — Z7982 Long term (current) use of aspirin: Secondary | ICD-10-CM | POA: Insufficient documentation

## 2017-11-07 DIAGNOSIS — I10 Essential (primary) hypertension: Secondary | ICD-10-CM | POA: Insufficient documentation

## 2017-11-07 DIAGNOSIS — Y929 Unspecified place or not applicable: Secondary | ICD-10-CM | POA: Insufficient documentation

## 2017-11-07 MED ORDER — ACETAMINOPHEN 325 MG PO TABS
650.0000 mg | ORAL_TABLET | Freq: Once | ORAL | Status: AC
  Administered 2017-11-07: 650 mg via ORAL
  Filled 2017-11-07: qty 2

## 2017-11-07 MED ORDER — TETANUS-DIPHTH-ACELL PERTUSSIS 5-2.5-18.5 LF-MCG/0.5 IM SUSP
0.5000 mL | Freq: Once | INTRAMUSCULAR | Status: AC
  Administered 2017-11-07: 0.5 mL via INTRAMUSCULAR
  Filled 2017-11-07: qty 0.5

## 2017-11-07 MED ORDER — DOXYCYCLINE HYCLATE 100 MG PO CAPS: 100.0000 mg | ORAL_CAPSULE | Freq: Two times a day (BID) | ORAL | 0 refills | Status: DC

## 2017-11-07 NOTE — Discharge Instructions (Signed)
Make sure you are icing the area and elevate your leg as needed.  Take Tylenol and ibuprofen as needed for the pain

## 2017-11-07 NOTE — ED Provider Notes (Signed)
[facility] EMERGENCY DEPARTMENT Provider Note   CSN: [id number] Arrival date & time: 11/07/17  1743     History   Chief Complaint Chief Complaint  Patient presents with  . Animal Bite    HPI Andrea Thompson is a 52 y.o. female.  Patient is a 52 year old female with a history of hypertension, depression and anxiety who is presenting today after she was bit by a pit bull.  She states her daughter has the dog in her home as a rescue and today the dog was on leash and they were walking it around so that it could pee and the patient walked by the dog and it just immediately latched onto her leg.  She has had pain and swelling around the area since this time.  All the vaccines for the dog are up-to-date.  It was abused and it has a history of aggression.  The history is provided by the patient.  Animal Bite  Contact animal:  Dog Location:  Leg Leg injury location:  R lower leg Time since incident:  2 hours Pain details:    Quality:  Aching, localized and sharp   Severity:  Moderate   Timing:  Constant   Progression:  Worsening Incident location:  Another residence Provoked: unprovoked   Notifications:  None Animal's rabies vaccination status:  Up to date Animal in possession: yes   Tetanus status:  Out of date Relieved by:  Cold compresses Worsened by:  Activity Ineffective treatments:  None tried Associated symptoms: swelling   Associated symptoms: no numbness     Past Medical History:  Diagnosis Date  . Allergy    allergic rhinitis  . Anxiety   . Depression   . GERD (gastroesophageal reflux disease)   . Hypertension   . Migraine headache   . Vitamin D deficiency     Patient Active Problem List   Diagnosis Date Noted  . Normocytic anemia 01/17/2017  . Chest pain 01/16/2017  . Bilateral lower extremity edema   . Hypokalemia   . Obesity 05/04/2015  . IBS (irritable bowel syndrome) 10/12/2011  . Anxiety 04/14/2011  . History of migraine headaches  04/14/2011  . Essential hypertension 06/11/2010  . PALPITATIONS 06/11/2010  . DEPRESSION 06/08/2010  . GERD 06/08/2010    Past Surgical History:  Procedure Laterality Date  . ABDOMINAL HYSTERECTOMY       OB History   None      Home Medications    Prior to Admission medications   Medication Sig Start Date End Date Taking? Authorizing Provider  albuterol (PROVENTIL HFA;VENTOLIN HFA) 108 (90 Base) MCG/ACT inhaler 2 sprays po qid prn 04/26/17   Margaree Mackintosh, MD  amLODipine (NORVASC) 5 MG tablet TAKE 1 TABLET EVERY DAY Patient not taking: Reported on 07/29/2017 06/05/17   Margaree Mackintosh, MD  aspirin EC 81 MG EC tablet Take 1 tablet (81 mg total) by mouth daily. 01/18/17   Dunn, Tacey Ruiz, PA-C  atorvastatin (LIPITOR) 40 MG tablet TAKE 1 TABLET BY MOUTH EVERY DAY IN THE EVENING 06/23/17   Azalee Course, [place]  Diethylpropion HCl CR 75 MG TB24 TAKE 1 TABLET BY MOUTH EVERY DAY Patient not taking: Reported on 07/29/2017 05/16/17   Margaree Mackintosh, MD  doxycycline (VIBRAMYCIN) 100 MG capsule Take 1 capsule (100 mg total) by mouth 2 (two) times daily. 11/07/17   Gwyneth Sprout, MD  hydrochlorothiazide (HYDRODIURIL) 25 MG tablet Take 1 tablet (25 mg total) by mouth daily. 07/04/17   Baxley,  Luanna Cole, MD  HYDROcodone-homatropine [facility]) 5-1.5 MG/5ML syrup Take 5 mLs by mouth every 8 (eight) hours as needed for cough. Patient not taking: Reported on 07/29/2017 04/04/17   Margaree Mackintosh, MD  KLOR-CON M20 20 MEQ tablet TAKE 2 TABLETS BY MOUTH EVERY DAY 06/23/17   Azalee Course, PA  losartan-hydrochlorothiazide (HYZAAR) 100-25 MG tablet TAKE 1 TABLET BY MOUTH DAILY. Patient not taking: Reported on 07/29/2017 06/05/17   Margaree Mackintosh, MD  magnesium oxide (MAG-OX) 400 (241.3 Mg) MG tablet TAKE 1 TABLET BY MOUTH EVERY DAY 03/25/17   Hilty, Lisette Abu, MD  nebivolol (BYSTOLIC) 10 MG tablet Bystolic 10 mg tablet    [provider]  pantoprazole (PROTONIX) 40 MG tablet TAKE 1 TABLET (40 MG TOTAL) BY MOUTH DAILY.  01/16/17   Margaree Mackintosh, MD  sertraline (ZOLOFT) 100 MG tablet TAKE 1 TABLET BY MOUTH EVERY DAY 07/05/17   Margaree Mackintosh, MD    Family History Family History  Problem Relation Age of Onset  . Heart disease Mother        started at age 20, recently had her 5th MI as of 2018  . Cancer Father     Social History Social History   Tobacco Use  . Smoking status: Former Smoker    Packs/day: 1.00    Types: Cigarettes    Last attempt to quit: 10/01/2005    Years since quitting: 12.1  . Smokeless tobacco: Never Used  Substance Use Topics  . Alcohol use: No  . Drug use: No     Allergies   Penicillins; Imitrex [sumatriptan base]; and Cinnamon   Review of Systems Review of Systems  Neurological: Negative for numbness.  All other systems reviewed and are negative.    Physical Exam Updated Vital Signs BP 139/69   Pulse 69   Temp 98.3 F (36.8 C)   Resp 18   Ht 5\' 1"  (1.549 m)   Wt 108.9 kg (240 lb)   SpO2 98%   BMI 45.35 kg/m   Physical Exam  Constitutional: She is oriented to person, place, and time. She appears well-developed and well-nourished. No distress.  HENT:  Head: Normocephalic and atraumatic.  Eyes: Pupils are equal, round, and reactive to light.  Cardiovascular: Normal rate.  Pulmonary/Chest: Effort normal.  Musculoskeletal:       Right lower leg: She exhibits tenderness and swelling. She exhibits no bony tenderness.       Legs: Neurological: She is alert and oriented to person, place, and time.  Skin: Skin is warm and dry. Capillary refill takes less than 2 seconds.  Psychiatric: She has a normal mood and affect. Her behavior is normal.  Nursing note and vitals reviewed.    ED Treatments / Results  Labs (all labs ordered are listed, but only abnormal results are displayed) Labs Reviewed - No data to display  EKG None  Radiology No results found.  Procedures Procedures (including critical care time)  Medications Ordered in ED Medications   acetaminophen (TYLENOL) tablet 650 mg (650 mg Oral Given 11/07/17 1800)  Tdap (BOOSTRIX) injection 0.5 mL (0.5 mLs Intramuscular Given 11/07/17 2016)     Initial Impression / Assessment and Plan / ED Course  I have reviewed the triage vital signs and the nursing notes.  Pertinent labs & imaging results that were available during my care of the patient were reviewed by me and considered in my medical decision making (see chart for details).     Patient here after  a dog bite today.  Dog's vaccines are up-to-date and no need for rabies.  Patient's tetanus shot was updated.  She was instructed to elevate, use ice and was started on doxycycline due to a penicillin allergy.  Final Clinical Impressions(s) / ED Diagnoses   Final diagnoses:  Dog bite of right lower leg, initial encounter    ED Discharge Orders        Ordered    doxycycline (VIBRAMYCIN) 100 MG capsule  2 times daily     11/07/17 2007       Gwyneth SproutPlunkett, Dezaree Tracey, MD 11/07/17 2258

## 2017-11-07 NOTE — ED Triage Notes (Signed)
Dog bite to R calf. Bruising and swelling noted. Dog is UTD on shots.

## 2017-11-15 ENCOUNTER — Other Ambulatory Visit: Payer: Self-pay

## 2017-11-15 ENCOUNTER — Emergency Department (HOSPITAL_BASED_OUTPATIENT_CLINIC_OR_DEPARTMENT_OTHER): Payer: BLUE CROSS/BLUE SHIELD

## 2017-11-15 ENCOUNTER — Encounter (HOSPITAL_BASED_OUTPATIENT_CLINIC_OR_DEPARTMENT_OTHER): Payer: Self-pay | Admitting: Emergency Medicine

## 2017-11-15 ENCOUNTER — Emergency Department (HOSPITAL_BASED_OUTPATIENT_CLINIC_OR_DEPARTMENT_OTHER)
Admission: EM | Admit: 2017-11-15 | Discharge: 2017-11-15 | Disposition: A | Discharge status: Home / Self Care | Payer: BLUE CROSS/BLUE SHIELD | Attending: Emergency Medicine | Admitting: Emergency Medicine

## 2017-11-15 DIAGNOSIS — Y999 Unspecified external cause status: Secondary | ICD-10-CM | POA: Diagnosis not present

## 2017-11-15 DIAGNOSIS — Y929 Unspecified place or not applicable: Secondary | ICD-10-CM | POA: Insufficient documentation

## 2017-11-15 DIAGNOSIS — Y939 Activity, unspecified: Secondary | ICD-10-CM | POA: Diagnosis not present

## 2017-11-15 DIAGNOSIS — I10 Essential (primary) hypertension: Secondary | ICD-10-CM | POA: Insufficient documentation

## 2017-11-15 DIAGNOSIS — Z87891 Personal history of nicotine dependence: Secondary | ICD-10-CM | POA: Insufficient documentation

## 2017-11-15 DIAGNOSIS — S8011XA Contusion of right lower leg, initial encounter: Secondary | ICD-10-CM | POA: Diagnosis not present

## 2017-11-15 DIAGNOSIS — Z5189 Encounter for other specified aftercare: Secondary | ICD-10-CM | POA: Insufficient documentation

## 2017-11-15 DIAGNOSIS — M79604 Pain in right leg: Secondary | ICD-10-CM | POA: Diagnosis not present

## 2017-11-15 DIAGNOSIS — T148XXA Other injury of unspecified body region, initial encounter: Secondary | ICD-10-CM

## 2017-11-15 DIAGNOSIS — M7989 Other specified soft tissue disorders: Secondary | ICD-10-CM | POA: Diagnosis not present

## 2017-11-15 DIAGNOSIS — Z79899 Other long term (current) drug therapy: Secondary | ICD-10-CM | POA: Insufficient documentation

## 2017-11-15 DIAGNOSIS — Z7982 Long term (current) use of aspirin: Secondary | ICD-10-CM | POA: Diagnosis not present

## 2017-11-15 DIAGNOSIS — W540XXD Bitten by dog, subsequent encounter: Secondary | ICD-10-CM | POA: Diagnosis not present

## 2017-11-15 DIAGNOSIS — M79606 Pain in leg, unspecified: Secondary | ICD-10-CM

## 2017-11-15 DIAGNOSIS — R319 Hematuria, unspecified: Secondary | ICD-10-CM | POA: Diagnosis not present

## 2017-11-15 MED ORDER — LIDOCAINE HCL (PF) 1 % IJ SOLN
5.0000 mL | Freq: Once | INTRAMUSCULAR | Status: AC
  Administered 2017-11-15: 5 mL
  Filled 2017-11-15: qty 5

## 2017-11-15 MED ORDER — HYDROCODONE-ACETAMINOPHEN 5-325 MG PO TABS
2.0000 | ORAL_TABLET | Freq: Once | ORAL | Status: AC
  Administered 2017-11-15: 2 via ORAL
  Filled 2017-11-15: qty 2

## 2017-11-15 MED ORDER — DOXYCYCLINE HYCLATE 100 MG PO CAPS: 100.0000 mg | ORAL_CAPSULE | Freq: Two times a day (BID) | ORAL | 0 refills | Status: AC

## 2017-11-15 NOTE — ED Provider Notes (Signed)
[facility] EMERGENCY DEPARTMENT Provider Note   CSN: [id number] Arrival date & time: 11/15/17  1107     History   Chief Complaint Chief Complaint  Patient presents with  . Wound Check    HPI Andrea Thompson is a 52 y.o. female with past medical history of anxiety, depression, GERD, hypertension who presents for evaluation of wound recheck.  Patient was seen here on 11/07/17 for evaluation of dog bite to her right lower extremity.  At that time, patient was started on doxycycline.  Patient comes in today because she has had increased swelling, pain, bruising to the area.  She states that the ecchymosis has begun spreading distally towards her leg.  She also reports that the pain is more severe in nature.  She reports she has been walking on the leg but does report some worsening pain with ambulation.  She was discharged home on doxycycline, which she states that she has been taking.  Patient denies any fevers, numbness/weakness.  The history is provided by the patient.    Past Medical History:  Diagnosis Date  . Allergy    allergic rhinitis  . Anxiety   . Depression   . GERD (gastroesophageal reflux disease)   . Hypertension   . Migraine headache   . Vitamin D deficiency     Patient Active Problem List   Diagnosis Date Noted  . Normocytic anemia 01/17/2017  . Chest pain 01/16/2017  . Bilateral lower extremity edema   . Hypokalemia   . Obesity 05/04/2015  . IBS (irritable bowel syndrome) 10/12/2011  . Anxiety 04/14/2011  . History of migraine headaches 04/14/2011  . Essential hypertension 06/11/2010  . PALPITATIONS 06/11/2010  . DEPRESSION 06/08/2010  . GERD 06/08/2010    Past Surgical History:  Procedure Laterality Date  . ABDOMINAL HYSTERECTOMY       OB History   None      Home Medications    Prior to Admission medications   Medication Sig Start Date End Date Taking? Authorizing Provider  albuterol (PROVENTIL HFA;VENTOLIN HFA) 108 (90 Base)  MCG/ACT inhaler 2 sprays po qid prn 04/26/17   Margaree Mackintosh, MD  amLODipine (NORVASC) 5 MG tablet TAKE 1 TABLET EVERY DAY Patient not taking: Reported on 07/29/2017 06/05/17   Margaree Mackintosh, MD  aspirin EC 81 MG EC tablet Take 1 tablet (81 mg total) by mouth daily. 01/18/17   Dunn, Tacey Ruiz, PA-C  atorvastatin (LIPITOR) 40 MG tablet TAKE 1 TABLET BY MOUTH EVERY DAY IN THE EVENING 06/23/17   Azalee Course, [place]  Diethylpropion HCl CR 75 MG TB24 TAKE 1 TABLET BY MOUTH EVERY DAY Patient not taking: Reported on 07/29/2017 05/16/17   Margaree Mackintosh, MD  doxycycline (VIBRAMYCIN) 100 MG capsule Take 1 capsule (100 mg total) by mouth 2 (two) times daily. 11/07/17   Gwyneth Sprout, MD  doxycycline (VIBRAMYCIN) 100 MG capsule Take 1 capsule (100 mg total) by mouth 2 (two) times daily for 7 days. 11/15/17 11/22/17  Maxwell Caul, PA-C  hydrochlorothiazide (HYDRODIURIL) 25 MG tablet Take 1 tablet (25 mg total) by mouth daily. 07/04/17   Margaree Mackintosh, MD  HYDROcodone-homatropine [facility]) 5-1.5 MG/5ML syrup Take 5 mLs by mouth every 8 (eight) hours as needed for cough. Patient not taking: Reported on 07/29/2017 04/04/17   Margaree Mackintosh, MD  KLOR-CON M20 20 MEQ tablet TAKE 2 TABLETS BY MOUTH EVERY DAY 06/23/17   Azalee Course, PA  losartan-hydrochlorothiazide (HYZAAR) 100-25 MG tablet TAKE 1  TABLET BY MOUTH DAILY. Patient not taking: Reported on 07/29/2017 06/05/17   Margaree Mackintosh, MD  magnesium oxide (MAG-OX) 400 (241.3 Mg) MG tablet TAKE 1 TABLET BY MOUTH EVERY DAY 03/25/17   Hilty, Lisette Abu, MD  nebivolol (BYSTOLIC) 10 MG tablet Bystolic 10 mg tablet    [provider]  pantoprazole (PROTONIX) 40 MG tablet TAKE 1 TABLET (40 MG TOTAL) BY MOUTH DAILY. 01/16/17   Margaree Mackintosh, MD  sertraline (ZOLOFT) 100 MG tablet TAKE 1 TABLET BY MOUTH EVERY DAY 07/05/17   Margaree Mackintosh, MD    Family History Family History  Problem Relation Age of Onset  . Heart disease Mother        started at age 79, recently had her 5th  MI as of 2018  . Cancer Father     Social History Social History   Tobacco Use  . Smoking status: Former Smoker    Packs/day: 1.00    Types: Cigarettes    Last attempt to quit: 10/01/2005    Years since quitting: 12.1  . Smokeless tobacco: Never Used  Substance Use Topics  . Alcohol use: No  . Drug use: No     Allergies   Penicillins; Imitrex [sumatriptan base]; and Cinnamon   Review of Systems Review of Systems  Constitutional: Negative for fever.  Respiratory: Negative for shortness of breath.   Musculoskeletal:       RLE pain  Skin: Positive for color change and wound.  Neurological: Negative for weakness and numbness.     Physical Exam Updated Vital Signs BP 134/62 (BP Location: Right Arm)   Pulse 66   Temp 98.5 F (36.9 C) (Oral)   Resp 18   Ht 5\' 1"  (1.549 m)   Wt 112.9 kg (249 lb)   SpO2 98%   BMI 47.05 kg/m   Physical Exam  Constitutional: She is oriented to person, place, and time. She appears well-developed and well-nourished.  HENT:  Head: Normocephalic and atraumatic.  Eyes: Pupils are equal, round, and reactive to light. EOM are normal.  Neck: Normal range of motion.  Cardiovascular: Normal pulses.  Pulses:      Dorsalis pedis pulses are 2+ on the right side, and 2+ on the left side.  Pulmonary/Chest: Effort normal and breath sounds normal.  Abdominal: Soft. Normal appearance. There is no tenderness. There is no rigidity and no guarding.  Musculoskeletal: Normal range of motion.  Tenderness to palpation noted to the posterior aspect of the right calf with ecchymosis that begins at the proximal right calf and extends distally down to the foot..  There is some firmness noted around the bite mark.  No surrounding warmth, erythema, induration.  No active drainage. Compartments are soft.   Neurological: She is alert and oriented to person, place, and time.  Sensation intact along major nerve distributions of BLE  Skin: Skin is warm and dry.  Capillary refill takes less than 2 seconds.  Good distal cap refill. RLE is not dusky in appearance or cool to touch.  Psychiatric: She has a normal mood and affect. Her speech is normal.  Nursing note and vitals reviewed.      ED Treatments / Results  Labs (all labs ordered are listed, but only abnormal results are displayed) Labs Reviewed - No data to display  EKG None  Radiology [place] Rt Lower Extrem Ltd Soft Tissue Non Vascular  Result Date: 11/15/2017 CLINICAL DATA:  Recent dog bite with bruising and pain, initial encounter EXAM:  ULTRASOUND RIGHT LOWER EXTREMITY LIMITED TECHNIQUE: Ultrasound examination of the lower extremity soft tissues was performed in the area of clinical concern. COMPARISON:  None. FINDINGS: Scanning in the area of clinical concern reveals a complex hypoechoic structure measuring 4.9 x 0.9 x 3.8 cm. No internal flow is noted in this likely represents a small fluid collection. This may represent a resolving hematoma. IMPRESSION: Hypoechoic structure in the area of clinical concern which may represent a focal resolving hematoma. Correlate with overlying clinical symptomatology. Electronically Signed   By: Alcide Clever M.D.   On: 11/15/2017 13:12    Procedures .Marland KitchenIncision and Drainage Date/Time: 11/15/2017 2:14 PM Performed by: Maxwell Caul, PA-C Authorized by: Maxwell Caul, PA-C   Consent:    Consent obtained:  Verbal   Consent given by:  Patient   Risks discussed:  Bleeding, incomplete drainage, pain and infection Location:    Type:  Hematoma   Location:  Lower extremity   Lower extremity location:  Leg   Leg location:  R upper leg Pre-procedure details:    Procedure prep: alcohol. Anesthesia (see MAR for exact dosages):    Anesthesia method:  Local infiltration   Local anesthetic:  Lidocaine 1% w/o epi Procedure type:    Complexity:  Simple Procedure details:    Needle aspiration: yes     Needle size:  18 G Comments:     After local  anesthesia, ultrasound guidance was used to place an 18-gauge needle in the area.  Positive visualization of the needle entering the hematoma was observed but there is no active drainage.  2 attempts were made, both by me and attending provider with no success of draining hematoma.  Given unsuccessful attempts, this procedure was terminated.    (including critical care time)  Medications Ordered in ED Medications  HYDROcodone-acetaminophen (NORCO/VICODIN) 5-325 MG per tablet 2 tablet (2 tablets Oral Given 11/15/17 1226)  lidocaine (PF) (XYLOCAINE) 1 % injection 5 mL (5 mLs Infiltration Given by Other 11/15/17 1342)     Initial Impression / Assessment and Plan / ED Course  I have reviewed the triage vital signs and the nursing notes.  Pertinent labs & imaging results that were available during my care of the patient were reviewed by me and considered in my medical decision making (see chart for details).     52 year old female who presents for evaluation of wound recheck.  Patient was bit by dog approximately 1 week ago.  Reports worsening pain, ecchymosis, swelling to the area.  No fevers.  No numbness/weakness.  Was discharged on doxycycline which she states she has been taking. Patient is afebrile, non-toxic appearing, sitting comfortably on examination table. Vital signs reviewed and stable. Patient is neurovascularly intact.  On exam, patient has diffuse ecchymosis that beginning at the proximal calf that extends distally.  There is some firmness surrounding the bite wound but no overlying warmth, erythema, induration.  Compartments are soft.  Consider hematoma versus abscess.  History/physical exam is not concerning for compartment syndrome.  Will give analgesics here in the department and evaluate with ultrasound.  Ultrasound shows a hypoechoic structure that represents a small fluid collection which may represent a small hematoma.  Discussed with patient regarding treatment options.  Patient  agreed to attempt to incision and drainage here in the ED.  Bedside ultrasound guidance was used with positive visualization of the needle into the hematoma but no drainage.  Attempt was made by ED provider with no success.  Procedure was terminated given unsuccessful  attempts.  Instructed patient on a supportive at home therapy measures.  Given that we made 2 more incisions into the area, will plan to start patient on repeat antibiotics.  Patient encouraged to follow-up with her primary care in the next 2 to 4 days. Patient had ample opportunity for questions and discussion. All patient's questions were answered with full understanding. Strict return precautions discussed. Patient expresses understanding and agreement to plan.    Final Clinical Impressions(s) / ED Diagnoses   Final diagnoses:  Leg pain  Visit for wound check  Hematoma    ED Discharge Orders        Ordered    doxycycline (VIBRAMYCIN) 100 MG capsule  2 times daily     11/15/17 1411       Rosana HoesLayden, Reiley Bertagnolli A, PA-C 11/15/17 1509    Tilden Fossaees, Elizabeth, MD 11/18/17 1258

## 2017-11-15 NOTE — ED Triage Notes (Signed)
Pt here for recheck of dog bite to R calf.

## 2017-11-15 NOTE — Discharge Instructions (Signed)
You can take Tylenol or Ibuprofen as directed for pain. You can alternate Tylenol and Ibuprofen every 4 hours. If you take Tylenol at 1pm, then you can take Ibuprofen at 5pm. Then you can take Tylenol again at 9pm.   Apply ice to the affected area.   Follow-up with your primary care doctor in the next 2-4 days.   Return for any worsening pain, numbness/weakness of the leg, fevers or any other worsening or concerning symptoms.

## 2017-11-15 NOTE — ED Notes (Signed)
ED Provider at bedside. Dr. Madilyn Hookees and [place] EneasLindsay, [place]

## 2017-11-21 ENCOUNTER — Encounter: Payer: Self-pay | Admitting: Internal Medicine

## 2017-11-21 ENCOUNTER — Ambulatory Visit (INDEPENDENT_AMBULATORY_CARE_PROVIDER_SITE_OTHER): Payer: BLUE CROSS/BLUE SHIELD | Admitting: Internal Medicine

## 2017-11-21 VITALS — BP 120/88 | HR 74 | Temp 98.3°F | Ht 61.0 in | Wt 236.0 lb

## 2017-11-21 DIAGNOSIS — S81851D Open bite, right lower leg, subsequent encounter: Secondary | ICD-10-CM | POA: Diagnosis not present

## 2017-11-21 LAB — CBC WITH DIFFERENTIAL/PLATELET
BASOS ABS: 59 {cells}/uL (ref 0–200)
Basophils Relative: 0.6 %
EOS ABS: 108 {cells}/uL (ref 15–500)
EOS PCT: 1.1 %
HEMATOCRIT: 36.4 % (ref 35.0–45.0)
HEMOGLOBIN: 12.6 g/dL (ref 11.7–15.5)
LYMPHS ABS: 2920 {cells}/uL (ref 850–3900)
MCH: 29.5 pg (ref 27.0–33.0)
MCHC: 34.6 g/dL (ref 32.0–36.0)
MCV: 85.2 fL (ref 80.0–100.0)
MPV: 9.8 fL (ref 7.5–12.5)
Monocytes Relative: 5 %
NEUTROS ABS: 6223 {cells}/uL (ref 1500–7800)
Neutrophils Relative %: 63.5 %
Platelets: 316 10*3/uL (ref 140–400)
RBC: 4.27 10*6/uL (ref 3.80–5.10)
RDW: 13.9 % (ref 11.0–15.0)
Total Lymphocyte: 29.8 %
WBC mixed population: 490 cells/uL (ref 200–950)
WBC: 9.8 10*3/uL (ref 3.8–10.8)

## 2017-11-21 MED ORDER — CEFTRIAXONE SODIUM 1 G IJ SOLR
1.0000 g | Freq: Once | INTRAMUSCULAR | Status: AC
  Administered 2017-11-21: 1 g via INTRAMUSCULAR

## 2017-11-21 MED ORDER — LEVOFLOXACIN 750 MG PO TABS
750.0000 mg | ORAL_TABLET | Freq: Every day | ORAL | 0 refills | Status: DC
Start: 2017-11-21 — End: 2019-01-25

## 2017-11-21 NOTE — Progress Notes (Signed)
   Subjective:    Patient ID: Andrea Thompson, female    DOB: 02/01/1966, 52 y.o.   MRN: [id number]  HPI Pt was bitten on right calf by daughter's pit bull rescue dog July 26th. Was seen in ED. Received Tdap update. Was started on Doxycycline.  Returned to ED August 3 for follow up. Noted to have hematome right lower leg. Attempt was made to aspirate it but no significant amount of fluid returned.Considerable bruising noted at that time. See photo in note. This has improved. However, still has hematoma right posteriolateral lower leg apprx 4.5 cm in daimeter very tender.    Review of Systemssee above- still on Doxycycline     Objective:   Physical Exam Contused areas RLE clearing up and less prevalent. Has apprx 4.5 cm in diameter hematoma that is not draining but tender to touch. Not hot to touch but slightly warm  Has mild edema right ankle. Takes diuretic.       Assessment & Plan:  Resolving contusions from dog bite with residual hematoma.  CBC with diff drawn. Do not think area represents abscess. Recommend warm hot compresses twice daily x 20 minutes. Discontinue Doxycycline. Levaquin 750 mg daily x 10 days. Stay off feet and out of heat. RTC Monday. 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

## 2017-11-21 NOTE — Patient Instructions (Signed)
Discontinue doxycycline.  Change to Levaquin 750 mg daily for 10 days.  Warm hot compresses to hematoma 20 minutes twice daily.  CBC with differential drawn.  Stay off her feet and keep them elevated.  Return to clinic Monday, August 12 for follow-up.  Note to be out of work this weekend

## 2017-11-24 ENCOUNTER — Encounter: Payer: Self-pay | Admitting: Internal Medicine

## 2017-11-24 ENCOUNTER — Ambulatory Visit (INDEPENDENT_AMBULATORY_CARE_PROVIDER_SITE_OTHER): Payer: BLUE CROSS/BLUE SHIELD | Admitting: Internal Medicine

## 2017-11-24 VITALS — BP 110/58 | HR 74 | Temp 98.1°F | Ht 61.0 in | Wt 236.0 lb

## 2017-11-24 DIAGNOSIS — S81851D Open bite, right lower leg, subsequent encounter: Secondary | ICD-10-CM | POA: Diagnosis not present

## 2017-11-24 NOTE — Patient Instructions (Signed)
Continue Levaquin 750 mg daily and warm hot compresses.  Return on Friday, August 16

## 2017-11-24 NOTE — Progress Notes (Signed)
   Subjective:    Patient ID: Andrea Thompson, female    DOB: 08/31/1965, 52 y.o.   MRN: [id number]  HPI Patient was seen here on August 9 for dog bite right calf.  Please see previous notes.  She was given IM Rocephin and started on Levaquin  750 mg daily.  She was out of work over the weekend.  She rested.  She has had a little bit loose stools with the Levaquin.  Has felt slightly nauseated once.  Needs to take medication with a full meal.  She feels her leg has improved.  She still has considerable firmness right calf in the area that is compatible with a hematoma.  No drainage noted.  No fever or chills.  Continued improvement with contused areas around this site.    Review of Systems     Objective:   Physical Exam   Approximate 4.5 cm area that is firm right posterior lateral without drainage.  Seems to be less warm to touch and less red.      Assessment & Plan:  Dog bite right calf  Plan: She will continue with Levaquin and return on Friday, August 16.  We talked about sending her to orthopedist to have this area incised and drained but she would rather wait and since she feels it is better.  It will take some time for this hematoma to resolve.  Injury occurred on July 26.

## 2017-11-28 ENCOUNTER — Ambulatory Visit (INDEPENDENT_AMBULATORY_CARE_PROVIDER_SITE_OTHER): Payer: BLUE CROSS/BLUE SHIELD | Admitting: Internal Medicine

## 2017-11-28 ENCOUNTER — Encounter: Payer: Self-pay | Admitting: Internal Medicine

## 2017-11-28 VITALS — BP 130/70 | HR 70 | Temp 98.2°F | Ht 61.0 in | Wt 240.0 lb

## 2017-11-28 DIAGNOSIS — S81851D Open bite, right lower leg, subsequent encounter: Secondary | ICD-10-CM

## 2017-11-28 NOTE — Patient Instructions (Addendum)
To have ortho evaluation for possible I and D of ? Hematoma vs abscess

## 2017-11-28 NOTE — Progress Notes (Signed)
   Subjective:    Patient ID: Andrea Thompson, female    DOB: 06/09/1965, 52 y.o.   MRN: [id number]  HPI 52 year old Female  for follow up of animal bite. Says bite area swelling is about the same size but less tender. No fever or chills. Still on Levaquin.    Review of Systems see above     Objective:   Physical Exam  Area of swelling right calf is now smaller but sewollen area is red and tender to touch. Suspect this is either a hematoma or abscess. Would like orthopedist to evaluate for possible I and D. Original bite occurred July 26th.      Assessment & Plan:  Hematoma vs abscess right calf secondary to dog bite  Have ortho see for possible I and D and evaluation.

## 2017-12-01 DIAGNOSIS — M65311 Trigger thumb, right thumb: Secondary | ICD-10-CM | POA: Diagnosis not present

## 2017-12-14 ENCOUNTER — Other Ambulatory Visit: Payer: Self-pay | Admitting: Physician Assistant

## 2017-12-17 NOTE — Telephone Encounter (Signed)
Rx request sent to pharmacy.  

## 2017-12-19 ENCOUNTER — Other Ambulatory Visit: Payer: Self-pay | Admitting: Physician Assistant

## 2017-12-27 ENCOUNTER — Other Ambulatory Visit: Payer: Self-pay | Admitting: Internal Medicine

## 2018-01-03 ENCOUNTER — Other Ambulatory Visit: Payer: Self-pay | Admitting: Internal Medicine

## 2018-01-03 ENCOUNTER — Other Ambulatory Visit: Payer: Self-pay | Admitting: Physician Assistant

## 2018-02-10 ENCOUNTER — Telehealth: Payer: Self-pay | Admitting: Internal Medicine

## 2018-02-10 MED ORDER — NEBIVOLOL HCL 5 MG PO TABS: 5.0000 mg | ORAL_TABLET | Freq: Two times a day (BID) | ORAL | 3 refills | Status: DC

## 2018-02-10 NOTE — Telephone Encounter (Signed)
Patient given samples of bystolic 5mg .

## 2018-02-21 IMAGING — NM NM MISC PROCEDURE
9 series · 54 of 54 positions shown · non-contrast
Comparison: none

[Series 1: wbr stress-gsp · 6.40mm/px · 6 of 512 frames shown]
[frame 43/512]
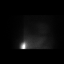
[frame 128/512]
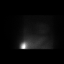
[frame 214/512]
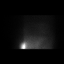
[frame 299/512]
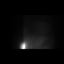
[frame 384/512]
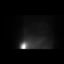
[frame 470/512]
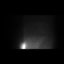

[Series 1: stress sax · 6.4mm · 6.40mm/px · 6 of 21 frames shown]
[frame 2/21]
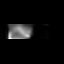
[frame 6/21]
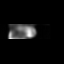
[frame 9/21]
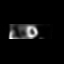
[frame 13/21]
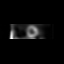
[frame 16/21]
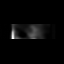
[frame 20/21]
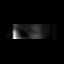

[Series 1: stress sax gs · 6.4mm · 6.40mm/px · 6 of 168 frames shown]
[frame 15/168]
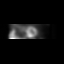
[frame 43/168]
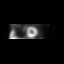
[frame 71/168]
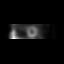
[frame 99/168]
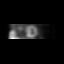
[frame 127/168]
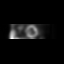
[frame 155/168]
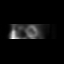

[Series 1: wbr_s-proj_st wbr stress-gsp · 6.40mm/px · 6 of 512 frames shown]
[frame 43/512]
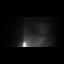
[frame 128/512]
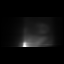
[frame 214/512]
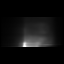
[frame 299/512]
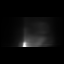
[frame 384/512]
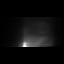
[frame 470/512]
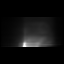

[Series 2: wbr_s-proj_st wbr stress-sum-em · 6.40mm/px · 6 of 64 frames shown]
[frame 6/64]
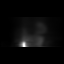
[frame 16/64]
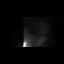
[frame 27/64]
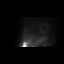
[frame 38/64]
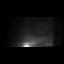
[frame 48/64]
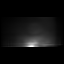
[frame 59/64]
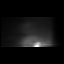

[Series 2: wbr stress-sum-em · 6.40mm/px · 6 of 64 frames shown]
[frame 6/64]
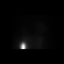
[frame 16/64]
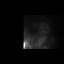
[frame 27/64]
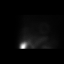
[frame 38/64]
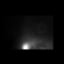
[frame 48/64]
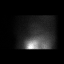
[frame 59/64]
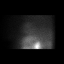

[Series 3: wbr rest · 6.40mm/px · 6 of 64 frames shown]
[frame 6/64]
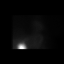
[frame 16/64]
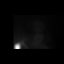
[frame 27/64]
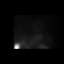
[frame 38/64]
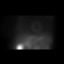
[frame 48/64]
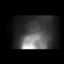
[frame 59/64]
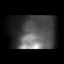

[Series 3: rest sax · 6.4mm · 6.40mm/px · 6 of 21 frames shown]
[frame 2/21]
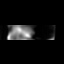
[frame 6/21]
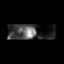
[frame 9/21]
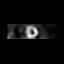
[frame 13/21]
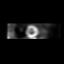
[frame 16/21]
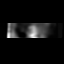
[frame 20/21]
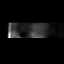

[Series 3: wbr_r-proj_st wbr rest · 6.40mm/px · 6 of 64 frames shown]
[frame 6/64]
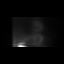
[frame 16/64]
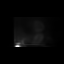
[frame 27/64]
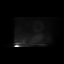
[frame 38/64]
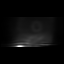
[frame 48/64]
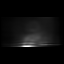
[frame 59/64]
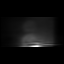

[54 of 54 positions shown; findings below may reference images not displayed]

Canned report from images found in remote index.

Refer to host system for actual result text.

## 2018-03-11 ENCOUNTER — Other Ambulatory Visit: Payer: Self-pay | Admitting: Physician Assistant

## 2018-05-23 ENCOUNTER — Other Ambulatory Visit: Payer: Self-pay | Admitting: Internal Medicine

## 2018-06-11 DIAGNOSIS — K353 Acute appendicitis with localized peritonitis, without perforation or gangrene: Secondary | ICD-10-CM | POA: Diagnosis not present

## 2018-06-11 DIAGNOSIS — K3580 Unspecified acute appendicitis: Secondary | ICD-10-CM | POA: Diagnosis not present

## 2018-06-11 DIAGNOSIS — K3589 Other acute appendicitis without perforation or gangrene: Secondary | ICD-10-CM | POA: Diagnosis not present

## 2018-06-11 DIAGNOSIS — Z01810 Encounter for preprocedural cardiovascular examination: Secondary | ICD-10-CM | POA: Diagnosis not present

## 2018-06-11 DIAGNOSIS — R1031 Right lower quadrant pain: Secondary | ICD-10-CM | POA: Diagnosis not present

## 2018-06-11 DIAGNOSIS — R110 Nausea: Secondary | ICD-10-CM | POA: Diagnosis not present

## 2018-06-11 DIAGNOSIS — K381 Appendicular concretions: Secondary | ICD-10-CM | POA: Diagnosis not present

## 2018-06-12 MED ORDER — AMLODIPINE BESYLATE 5 MG PO TABS
5.00 | ORAL_TABLET | ORAL | Status: DC
Start: 2018-06-12 — End: 2018-06-12

## 2018-06-12 MED ORDER — ENOXAPARIN SODIUM 40 MG/0.4ML ~~LOC~~ SOLN
40.00 | SUBCUTANEOUS | Status: DC
Start: 2018-06-13 — End: 2018-06-12

## 2018-06-12 MED ORDER — KCL IN DEXTROSE-NACL 20-5-0.45 MEQ/L-%-% IV SOLN
INTRAVENOUS | Status: DC
Start: ? — End: 2018-06-12

## 2018-06-12 MED ORDER — METOPROLOL SUCCINATE ER 50 MG PO TB24
100.00 | ORAL_TABLET | ORAL | Status: DC
Start: 2018-06-13 — End: 2018-06-12

## 2018-06-12 MED ORDER — SERTRALINE HCL 100 MG PO TABS
100.00 | ORAL_TABLET | ORAL | Status: DC
Start: 2018-06-13 — End: 2018-06-12

## 2018-06-12 MED ORDER — ACETAMINOPHEN 325 MG PO TABS
650.00 | ORAL_TABLET | ORAL | Status: DC
Start: ? — End: 2018-06-12

## 2018-06-12 MED ORDER — PANTOPRAZOLE SODIUM 40 MG PO TBEC
40.00 | DELAYED_RELEASE_TABLET | ORAL | Status: DC
Start: 2018-06-13 — End: 2018-06-12

## 2018-06-12 MED ORDER — POTASSIUM CHLORIDE CRYS ER 20 MEQ PO TBCR
20.00 | EXTENDED_RELEASE_TABLET | ORAL | Status: DC
Start: 2018-06-12 — End: 2018-06-12

## 2018-06-12 MED ORDER — HYDROCODONE-ACETAMINOPHEN 5-325 MG PO TABS
1.00 | ORAL_TABLET | ORAL | Status: DC
Start: ? — End: 2018-06-12

## 2018-06-12 MED ORDER — GENERIC EXTERNAL MEDICATION
12.50 | Status: DC
Start: ? — End: 2018-06-12

## 2018-06-12 MED ORDER — HYDROCODONE-ACETAMINOPHEN 5-325 MG PO TABS
2.00 | ORAL_TABLET | ORAL | Status: DC
Start: ? — End: 2018-06-12

## 2018-06-12 MED ORDER — ATORVASTATIN CALCIUM 40 MG PO TABS
40.00 | ORAL_TABLET | ORAL | Status: DC
Start: 2018-06-12 — End: 2018-06-12

## 2018-06-12 MED ORDER — ONDANSETRON HCL 4 MG/2ML IJ SOLN
4.00 | INTRAMUSCULAR | Status: DC
Start: ? — End: 2018-06-12

## 2018-06-24 ENCOUNTER — Other Ambulatory Visit: Payer: Self-pay | Admitting: Internal Medicine

## 2018-06-24 DIAGNOSIS — Z719 Counseling, unspecified: Secondary | ICD-10-CM | POA: Diagnosis not present

## 2018-06-30 ENCOUNTER — Other Ambulatory Visit: Payer: Self-pay | Admitting: Internal Medicine

## 2018-08-09 ENCOUNTER — Other Ambulatory Visit: Payer: Self-pay | Admitting: Internal Medicine

## 2018-08-10 ENCOUNTER — Other Ambulatory Visit: Payer: Self-pay

## 2018-08-10 MED ORDER — ATORVASTATIN CALCIUM 40 MG PO TABS: 40.0000 mg | ORAL_TABLET | Freq: Every day | ORAL | 0 refills | Status: DC

## 2018-08-17 ENCOUNTER — Other Ambulatory Visit: Payer: Self-pay

## 2018-08-17 DIAGNOSIS — E669 Obesity, unspecified: Secondary | ICD-10-CM

## 2018-08-17 NOTE — Telephone Encounter (Signed)
[facility], she will come by before  1pm to pick up.

## 2018-08-17 NOTE — Telephone Encounter (Signed)
Please call her to pick up Rx.

## 2018-08-22 IMAGING — US US EXTREM LOW*R* LIMITED
1 series · 13 of 13 positions shown · non-contrast
Comparison: None.

CLINICAL DATA: Recent dog bite with bruising and pain, initial
encounter

EXAM:
ULTRASOUND RIGHT LOWER EXTREMITY LIMITED
TECHNIQUE: Ultrasound examination of the lower extremity soft tissues was
performed in the area of clinical concern.

[Series 1: us extrem low*right* limited · 0.05mm/px · 13 of 13 slices shown]
[im 1/13]
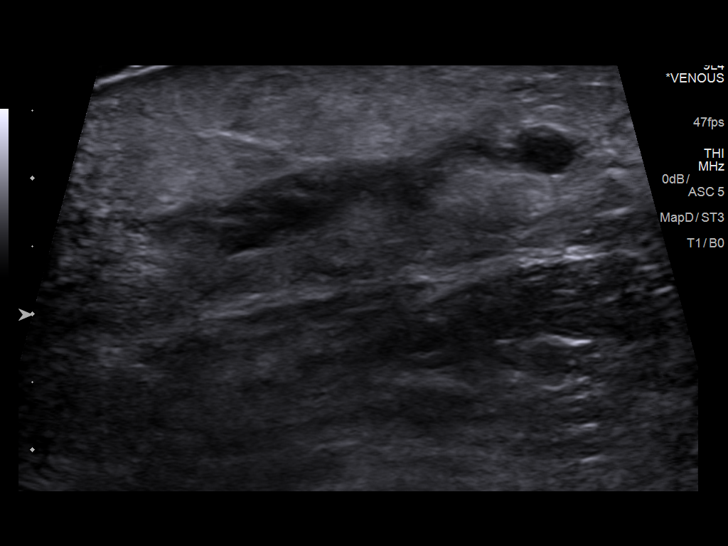
[im 2/13]
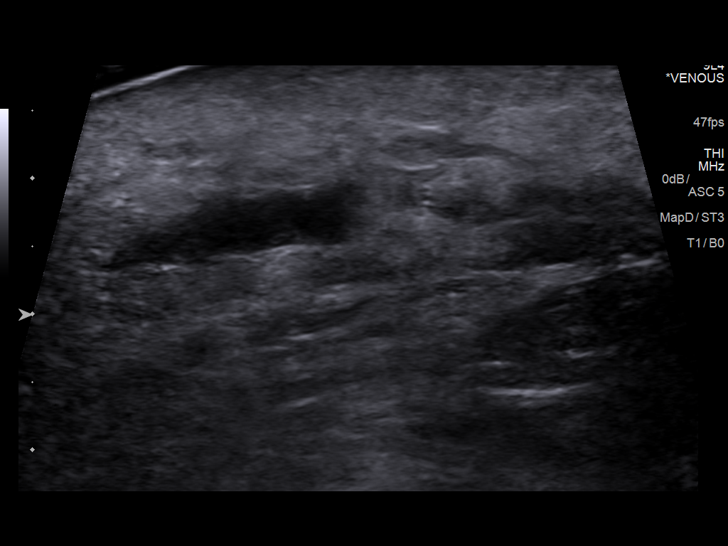
[im 3/13]
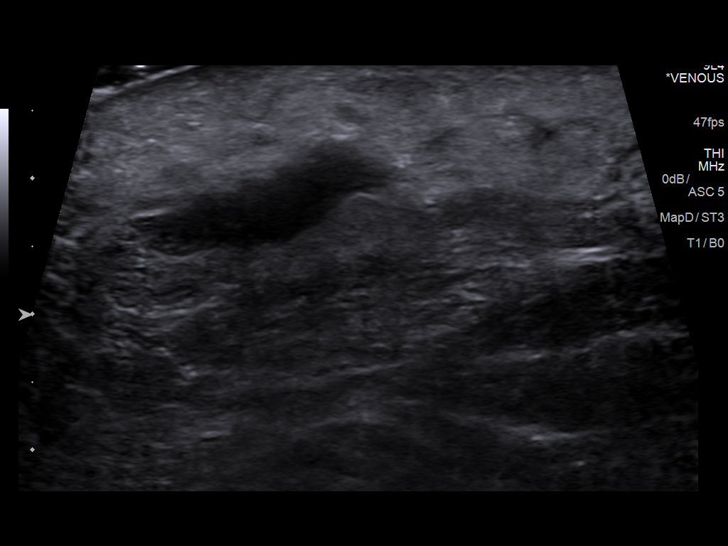
[im 4/13]
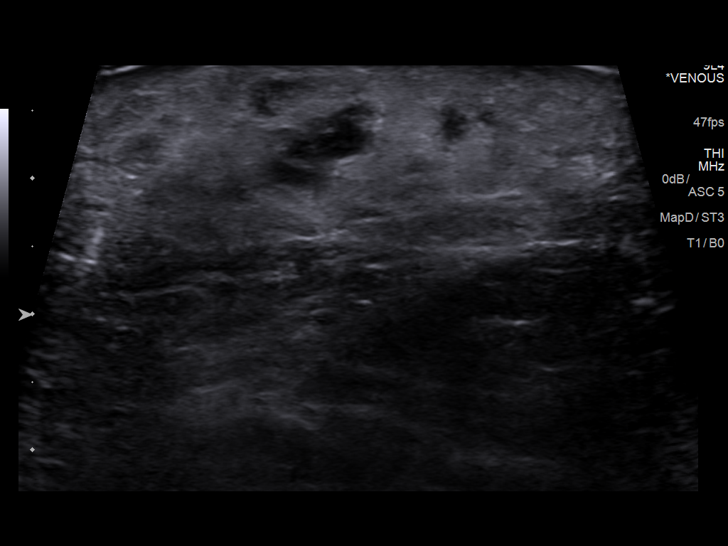
[im 5/13]
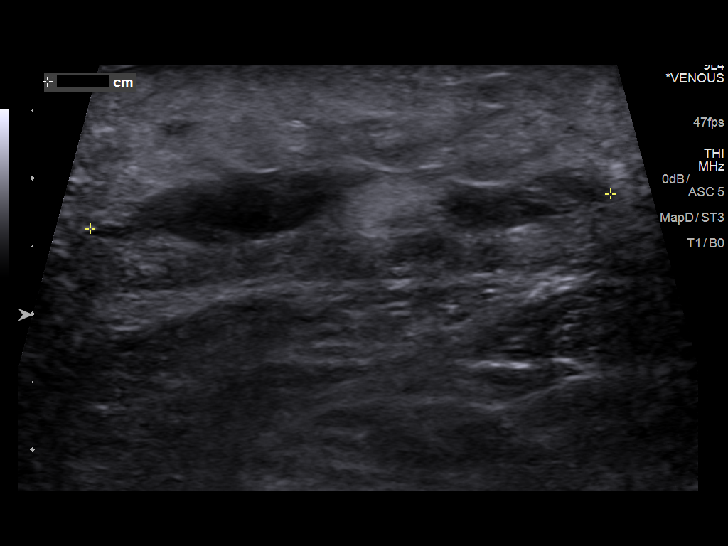
[im 6/13]
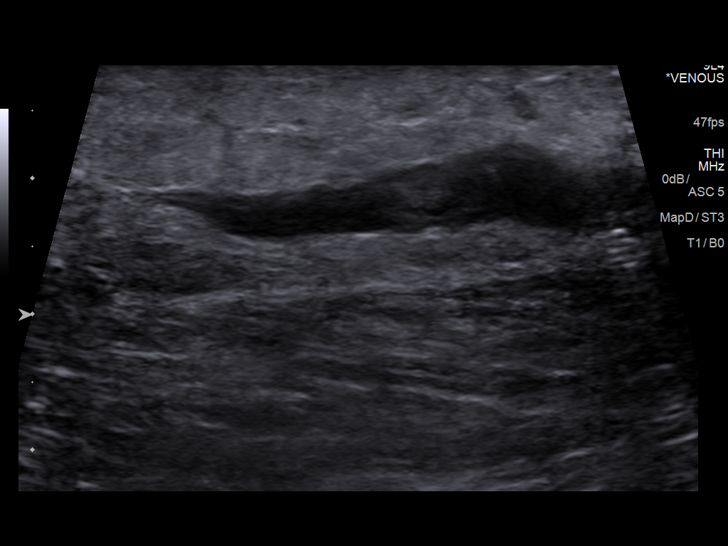
[im 7/13]
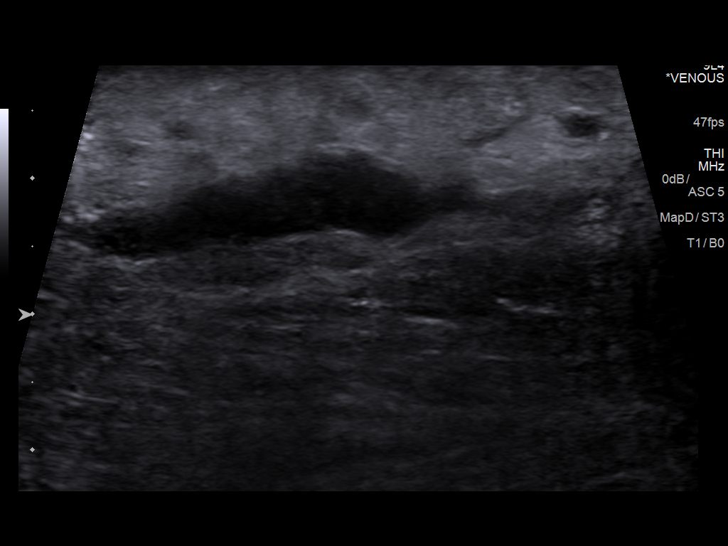
[im 8/13]
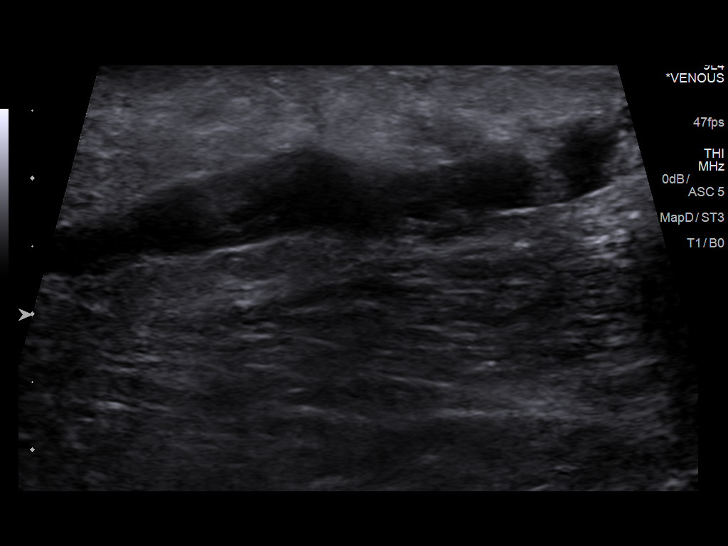
[im 9/13]
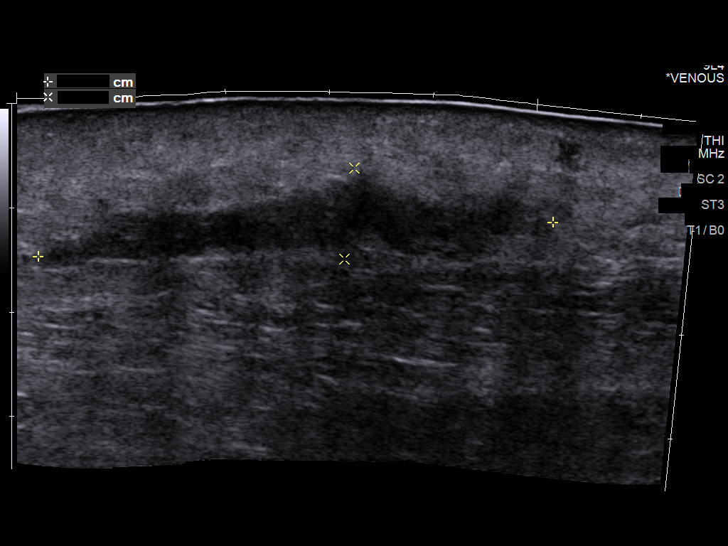
[im 10/13]
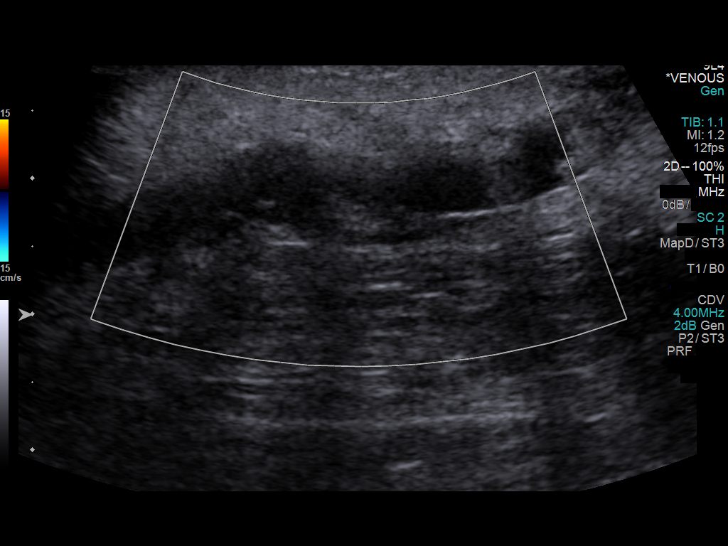
[im 11/13]
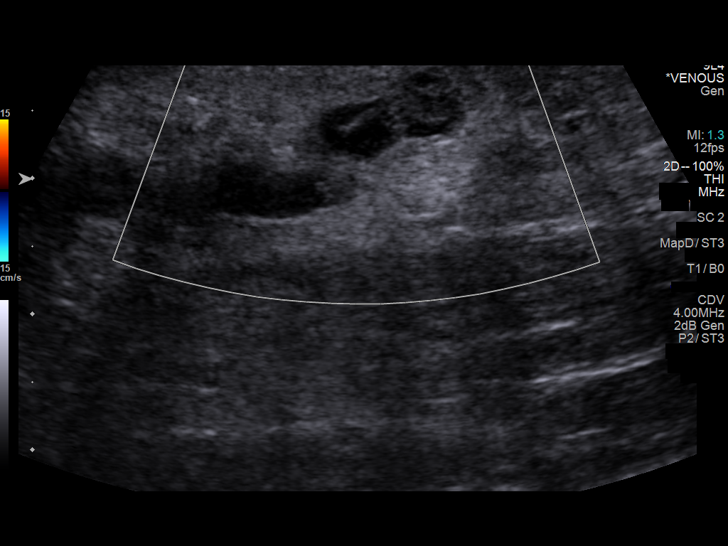
[im 12/13]
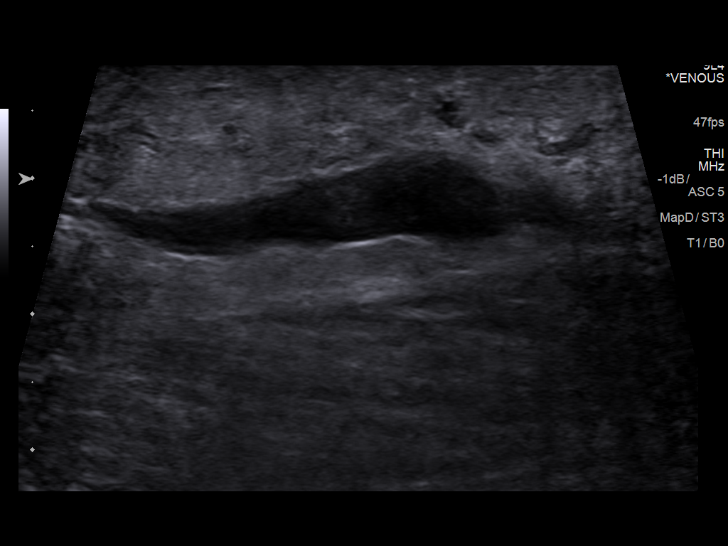
[im 13/13]
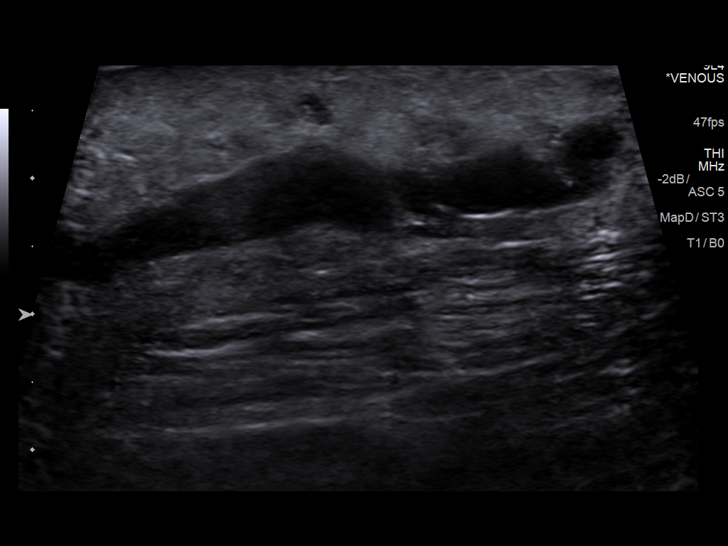

[13 of 13 positions shown; findings below may reference images not displayed]

FINDINGS: Scanning in the area of clinical concern reveals a complex
hypoechoic structure measuring 4.9 x 0.9 x 3.8 cm. No internal flow
is noted in this likely represents a small fluid collection. This
may represent a resolving hematoma..
IMPRESSION: Hypoechoic structure in the area of clinical concern which may
represent a focal resolving hematoma. Correlate with overlying
clinical symptomatology.

## 2018-09-01 ENCOUNTER — Other Ambulatory Visit: Payer: Self-pay

## 2018-09-01 MED ORDER — NEBIVOLOL HCL 10 MG PO TABS: 10.0000 mg | ORAL_TABLET | Freq: Every day | ORAL | 1 refills | Status: DC

## 2018-09-16 ENCOUNTER — Other Ambulatory Visit: Payer: Self-pay

## 2018-09-16 MED ORDER — POTASSIUM CHLORIDE CRYS ER 20 MEQ PO TBCR: 40.0000 meq | EXTENDED_RELEASE_TABLET | Freq: Every day | ORAL | 0 refills | Status: DC

## 2018-11-10 MED ORDER — DIETHYLPROPION HCL ER 75 MG PO TB24: 1.0000 | ORAL_TABLET | Freq: Every day | ORAL | 0 refills | Status: DC

## 2018-12-09 ENCOUNTER — Other Ambulatory Visit: Payer: Self-pay | Admitting: Internal Medicine

## 2018-12-11 ENCOUNTER — Other Ambulatory Visit: Payer: Self-pay | Admitting: Physician Assistant

## 2018-12-11 NOTE — Telephone Encounter (Signed)
Please advise if OK to refill. Last seen on 02/04/2017.

## 2018-12-17 ENCOUNTER — Other Ambulatory Visit: Payer: Self-pay | Admitting: Internal Medicine

## 2018-12-17 NOTE — Telephone Encounter (Signed)
Please advise if OK to refill. Last seen 01/2017. 

## 2019-01-25 ENCOUNTER — Ambulatory Visit (INDEPENDENT_AMBULATORY_CARE_PROVIDER_SITE_OTHER): Payer: 59 | Admitting: Internal Medicine

## 2019-01-25 ENCOUNTER — Encounter: Payer: Self-pay | Admitting: Internal Medicine

## 2019-01-25 ENCOUNTER — Other Ambulatory Visit: Payer: Self-pay | Admitting: Internal Medicine

## 2019-01-25 ENCOUNTER — Other Ambulatory Visit: Payer: Self-pay

## 2019-01-25 VITALS — BP 120/80 | HR 83 | Temp 98.0°F | Ht 61.0 in | Wt 236.0 lb

## 2019-01-25 DIAGNOSIS — W57XXXA Bitten or stung by nonvenomous insect and other nonvenomous arthropods, initial encounter: Secondary | ICD-10-CM

## 2019-01-25 DIAGNOSIS — M19041 Primary osteoarthritis, right hand: Secondary | ICD-10-CM | POA: Diagnosis not present

## 2019-01-25 DIAGNOSIS — Z0000 Encounter for general adult medical examination without abnormal findings: Secondary | ICD-10-CM | POA: Diagnosis not present

## 2019-01-25 DIAGNOSIS — J301 Allergic rhinitis due to pollen: Secondary | ICD-10-CM

## 2019-01-25 DIAGNOSIS — I10 Essential (primary) hypertension: Secondary | ICD-10-CM | POA: Diagnosis not present

## 2019-01-25 DIAGNOSIS — E785 Hyperlipidemia, unspecified: Secondary | ICD-10-CM

## 2019-01-25 DIAGNOSIS — Z6841 Body Mass Index (BMI) 40.0 and over, adult: Secondary | ICD-10-CM

## 2019-01-25 DIAGNOSIS — Z8249 Family history of ischemic heart disease and other diseases of the circulatory system: Secondary | ICD-10-CM | POA: Diagnosis not present

## 2019-01-25 DIAGNOSIS — Z8669 Personal history of other diseases of the nervous system and sense organs: Secondary | ICD-10-CM | POA: Diagnosis not present

## 2019-01-25 DIAGNOSIS — F439 Reaction to severe stress, unspecified: Secondary | ICD-10-CM

## 2019-01-25 DIAGNOSIS — M19042 Primary osteoarthritis, left hand: Secondary | ICD-10-CM

## 2019-01-25 DIAGNOSIS — K219 Gastro-esophageal reflux disease without esophagitis: Secondary | ICD-10-CM

## 2019-01-25 DIAGNOSIS — Z124 Encounter for screening for malignant neoplasm of cervix: Secondary | ICD-10-CM | POA: Diagnosis not present

## 2019-01-25 DIAGNOSIS — F4541 Pain disorder exclusively related to psychological factors: Secondary | ICD-10-CM | POA: Diagnosis not present

## 2019-01-25 LAB — POCT URINALYSIS DIPSTICK
Appearance: NEGATIVE
Bilirubin, UA: NEGATIVE
Blood, UA: NEGATIVE
Glucose, UA: NEGATIVE
Ketones, UA: NEGATIVE
Leukocytes, UA: NEGATIVE
Nitrite, UA: NEGATIVE
Odor: NEGATIVE
Protein, UA: NEGATIVE
Spec Grav, UA: 1.015 (ref 1.010–1.025)
Urobilinogen, UA: 0.2 E.U./dL
pH, UA: 6.5 (ref 5.0–8.0)

## 2019-01-25 MED ORDER — DIETHYLPROPION HCL ER 75 MG PO TB24: 1.0000 | ORAL_TABLET | Freq: Every day | ORAL | 0 refills | Status: DC

## 2019-01-25 MED ORDER — POTASSIUM CHLORIDE CRYS ER 20 MEQ PO TBCR: 40.0000 meq | EXTENDED_RELEASE_TABLET | Freq: Every day | ORAL | 0 refills | Status: DC

## 2019-01-25 MED ORDER — ATORVASTATIN CALCIUM 40 MG PO TABS: 40.0000 mg | ORAL_TABLET | Freq: Every day | ORAL | 0 refills | Status: DC

## 2019-01-25 MED ORDER — TRIAMCINOLONE ACETONIDE 0.1 % EX CREA: 1.0000 "application " | TOPICAL_CREAM | Freq: Two times a day (BID) | CUTANEOUS | 0 refills | Status: DC

## 2019-01-25 NOTE — Progress Notes (Signed)
Subjective:    Patient ID: Andrea Thompson, female    DOB: 1966/02/19, 53 y.o.   MRN: [id number]  HPI 53 year old Female for health maintenance exam and evaluation of medical issues.   Had appendectomy at [facility] Feb 2020.  Needs mammogram. Order placed. Flu vaccine given recently at work.  She has a history of hypertension which at times has been labile.  She takes potassium supplement for hypokalemia.  She takes Lipitor for hyperlipidemia.  Zoloft has been prescribed for anxiety and depression.  In February 2016 she lost 33 pounds with diet and exercise.  Unfortunately has gained weight.  Mother remains in a nursing home.  She requires a lot of attention.  Patient continues to work full-time at [employer].  She resides in [place].  History of ventricular bigeminy and palpitations which are well controlled with Bystolic.  Hypertension is longstanding.  In February 2012 she was found to have ventricular bigeminy during  evaluation in the emergency department for chest pain and palpitations.  She had a stress echocardiogram that was normal.  She was reassured that she was at low risk for coronary disease.  History of migraine headaches, irritable bowel syndrome, allergic rhinitis, GE reflux and anxiety and depression.  History of hysterectomy without oophorectomy 2002 for menorrhagia.    Social history: She is married and has a GED degree.  She is a Scientist, physiological for [employer].  Husband works for [employer].  Patient previously smoked a pack of cigarettes daily for some 19 years but stopped in 2008.  She has 1 son and 2 daughters.  Family history: Mother with history of coronary artery disease and angioplasty at age 80 and had CABG at age 54.  Mother subsequently had MI at age 61.  Mother is debilitated and apparently has had a stroke now living in nursing facility but formally lived with patient.  1 brother in good health.  Father died of lung cancer  11-02-2002.  Grandfather with history of CABG.    Review of Systems  Constitutional: Positive for fatigue.  Respiratory: Negative.   Cardiovascular: Negative for chest pain.  Gastrointestinal: Negative.   Genitourinary: Negative.   Neurological:       History of migraine headaches  Psychiatric/Behavioral:       Situational stress with mother creating anxiety       Objective:   Physical Exam Vitals signs reviewed.  Constitutional:      General: She is not in acute distress.    Appearance: Normal appearance. She is obese.  HENT:     Head: Normocephalic and atraumatic.     Right Ear: Tympanic membrane and ear canal normal.     Left Ear: Tympanic membrane and ear canal normal.     Nose: Nose normal.     Mouth/Throat:     Mouth: Mucous membranes are moist.     Pharynx: Oropharynx is clear.  Eyes:     General: No scleral icterus.       Right eye: No discharge.        Left eye: No discharge.     Conjunctiva/sclera: Conjunctivae normal.     Pupils: Pupils are equal, round, and reactive to light.  Neck:     Musculoskeletal: Neck supple. No neck rigidity.     Vascular: No carotid bruit.     Comments: No thyromegaly Cardiovascular:     Rate and Rhythm: Normal rate and regular rhythm.     Pulses:  Normal pulses.     Heart sounds: Normal heart sounds.  Pulmonary:     Effort: Pulmonary effort is normal.     Comments: Breasts without masses Abdominal:     General: Bowel sounds are normal.     Palpations: Abdomen is soft. There is no mass.     Tenderness: There is no abdominal tenderness. There is no rebound.  Genitourinary:    Comments: Status post hysterectomy.  Bimanual normal.  Pap taken of vaginal cuff Musculoskeletal:        General: No swelling.     Right lower leg: No edema.     Left lower leg: No edema.     Comments: Bilateral Heberden's and Bouchard's nodes consistent with osteoarthritis  Lymphadenopathy:     Cervical: No cervical adenopathy.  Skin:    General:  Skin is warm and dry.     Findings: No rash.  Neurological:     General: No focal deficit present.     Mental Status: She is alert and oriented to person, place, and time.     Cranial Nerves: No cranial nerve deficit.     Sensory: No sensory deficit.     Coordination: Coordination normal.     Gait: Gait normal.  Psychiatric:        Mood and Affect: Mood normal.        Behavior: Behavior normal.        Thought Content: Thought content normal.        Judgment: Judgment normal.    BP 120/80, pulse 83, Weight 236 pounds, BMI 44.59       Assessment & Plan:  Essential hypertension-stable on current regimen of Bystolic and amlodipine as well as HCTZ  History of migraine headaches  Hypokalemia treated with potassium supplement History of irritable bowel syndrome  Bilateral hand osteoarthritis  Obesity-BMI 44.59-patient previously had success with Tenuate 75 mg XR daily and that will be continued.  I have discussed Dr. Migdalia Dk clinic with her.  Situational stress  Anxiety depression treated with Zoloft and stable  Allergic rhinitis has proair inhaler if needed for wheezing  History of ventricular bigeminy treated with Bystolic  History of GE reflux treated with PPI  Multiple insect bites-should resolve  Plan: Labs reviewed and are essentially within normal limits.  She does have a low HDL of 41.  TSH is normal.  Needs to have mammogram    Hyperlipidemia treated with statin

## 2019-01-26 ENCOUNTER — Other Ambulatory Visit: Payer: 59 | Admitting: Internal Medicine

## 2019-01-26 ENCOUNTER — Other Ambulatory Visit (HOSPITAL_COMMUNITY)
Admission: RE | Admit: 2019-01-26 | Discharge: 2019-01-26 | Disposition: A | Discharge status: Home / Self Care | Payer: 59 | Source: Ambulatory Visit | Attending: Internal Medicine | Admitting: Internal Medicine

## 2019-01-26 DIAGNOSIS — F4541 Pain disorder exclusively related to psychological factors: Secondary | ICD-10-CM

## 2019-01-26 DIAGNOSIS — Z0000 Encounter for general adult medical examination without abnormal findings: Secondary | ICD-10-CM | POA: Insufficient documentation

## 2019-01-26 DIAGNOSIS — Z1321 Encounter for screening for nutritional disorder: Secondary | ICD-10-CM

## 2019-01-26 DIAGNOSIS — Z1329 Encounter for screening for other suspected endocrine disorder: Secondary | ICD-10-CM

## 2019-01-26 DIAGNOSIS — F439 Reaction to severe stress, unspecified: Secondary | ICD-10-CM

## 2019-01-26 DIAGNOSIS — Z124 Encounter for screening for malignant neoplasm of cervix: Secondary | ICD-10-CM | POA: Insufficient documentation

## 2019-01-26 DIAGNOSIS — I10 Essential (primary) hypertension: Secondary | ICD-10-CM

## 2019-01-26 DIAGNOSIS — E785 Hyperlipidemia, unspecified: Secondary | ICD-10-CM

## 2019-01-26 DIAGNOSIS — F419 Anxiety disorder, unspecified: Secondary | ICD-10-CM

## 2019-01-26 DIAGNOSIS — M19041 Primary osteoarthritis, right hand: Secondary | ICD-10-CM

## 2019-01-27 ENCOUNTER — Other Ambulatory Visit: Payer: Self-pay | Admitting: Internal Medicine

## 2019-01-27 LAB — CBC WITH DIFFERENTIAL/PLATELET
Absolute Monocytes: 378 cells/uL (ref 200–950)
Basophils Absolute: 49 cells/uL (ref 0–200)
Basophils Relative: 0.8 %
Eosinophils Absolute: 92 cells/uL (ref 15–500)
Eosinophils Relative: 1.5 %
HCT: 37.1 % (ref 35.0–45.0)
Hemoglobin: 12.6 g/dL (ref 11.7–15.5)
Lymphs Abs: 2025 cells/uL (ref 850–3900)
MCH: 29.2 pg (ref 27.0–33.0)
MCHC: 34 g/dL (ref 32.0–36.0)
MCV: 86.1 fL (ref 80.0–100.0)
MPV: 9.6 fL (ref 7.5–12.5)
Monocytes Relative: 6.2 %
Neutro Abs: 3556 cells/uL (ref 1500–7800)
Neutrophils Relative %: 58.3 %
Platelets: 249 10*3/uL (ref 140–400)
RBC: 4.31 10*6/uL (ref 3.80–5.10)
RDW: 12.9 % (ref 11.0–15.0)
Total Lymphocyte: 33.2 %
WBC: 6.1 10*3/uL (ref 3.8–10.8)

## 2019-01-27 LAB — LIPID PANEL
Cholesterol: 159 mg/dL (ref <200)
HDL: 41 mg/dL — ABNORMAL LOW (ref >=50)
LDL Cholesterol (Calc): 101 mg/dL (calc) — ABNORMAL HIGH
Non-HDL Cholesterol (Calc): 118 mg/dL (calc) (ref <130)
Total CHOL/HDL Ratio: 3.9 (calc) (ref <5.0)
Triglycerides: 80 mg/dL (ref <150)

## 2019-01-27 LAB — COMPLETE METABOLIC PANEL WITH GFR
AG Ratio: 1.6 (calc) (ref 1.0–2.5)
ALT: 17 U/L (ref 6–29)
AST: 12 U/L (ref 10–35)
Albumin: 4.2 g/dL (ref 3.6–5.1)
Alkaline phosphatase (APISO): 82 U/L (ref 37–153)
BUN: 14 mg/dL (ref 7–25)
CO2: 29 mmol/L (ref 20–32)
Calcium: 9.1 mg/dL (ref 8.6–10.4)
Chloride: 105 mmol/L (ref 98–110)
Creat: 0.63 mg/dL (ref 0.50–1.05)
GFR, Est African American: 120 mL/min/{1.73_m2} (ref >=60)
GFR, Est Non African American: 103 mL/min/{1.73_m2} (ref >=60)
Globulin: 2.7 g/dL (calc) (ref 1.9–3.7)
Glucose, Bld: 92 mg/dL (ref 65–99)
Potassium: 4.1 mmol/L (ref 3.5–5.3)
Sodium: 140 mmol/L (ref 135–146)
Total Bilirubin: 0.4 mg/dL (ref 0.2–1.2)
Total Protein: 6.9 g/dL (ref 6.1–8.1)

## 2019-01-27 LAB — TSH: TSH: 4 mIU/L

## 2019-01-27 LAB — VITAMIN D 25 HYDROXY (VIT D DEFICIENCY, FRACTURES): Vit D, 25-Hydroxy: 33 ng/mL (ref 30–100)

## 2019-01-28 LAB — CERVICOVAGINAL ANCILLARY ONLY: HPV: NOT DETECTED

## 2019-02-02 LAB — CYTOLOGY - PAP
Comment: NEGATIVE
Diagnosis: NEGATIVE
High risk HPV: NEGATIVE

## 2019-02-12 ENCOUNTER — Encounter: Payer: Self-pay | Admitting: Internal Medicine

## 2019-02-12 DIAGNOSIS — Z6841 Body Mass Index (BMI) 40.0 and over, adult: Secondary | ICD-10-CM | POA: Insufficient documentation

## 2019-02-12 DIAGNOSIS — J309 Allergic rhinitis, unspecified: Secondary | ICD-10-CM | POA: Insufficient documentation

## 2019-02-12 DIAGNOSIS — F439 Reaction to severe stress, unspecified: Secondary | ICD-10-CM | POA: Insufficient documentation

## 2019-02-12 NOTE — Patient Instructions (Addendum)
Continue weight for weight loss.  Consider Dr. Migdalia Dk clinic.  Continue current medications.  Try to get some exercise.  Follow-up in 6 months.  Have mammogram.  Flu vaccine given in September.

## 2019-02-16 ENCOUNTER — Other Ambulatory Visit: Payer: Self-pay | Admitting: Internal Medicine

## 2019-03-08 ENCOUNTER — Other Ambulatory Visit: Payer: Self-pay

## 2019-03-08 MED ORDER — DIETHYLPROPION HCL ER 75 MG PO TB24: 1.0000 | ORAL_TABLET | Freq: Every day | ORAL | 2 refills | Status: DC

## 2019-04-18 ENCOUNTER — Other Ambulatory Visit: Payer: Self-pay | Admitting: Internal Medicine

## 2019-04-19 ENCOUNTER — Telehealth: Payer: Self-pay | Admitting: Internal Medicine

## 2019-04-19 NOTE — Telephone Encounter (Signed)
Called and spoke to Andrea Thompson about going online and downloading the [employer] for 2021 and take to pharmacy, should help, it states you can get RX for 90 day supply for as low as $35

## 2019-04-24 ENCOUNTER — Other Ambulatory Visit: Payer: Self-pay | Admitting: Internal Medicine

## 2019-05-07 ENCOUNTER — Telehealth: Payer: Self-pay | Admitting: Internal Medicine

## 2019-05-07 MED ORDER — AMLODIPINE BESYLATE 5 MG PO TABS: 5.0000 mg | ORAL_TABLET | Freq: Every day | ORAL | 3 refills | Status: DC

## 2019-05-07 NOTE — Telephone Encounter (Signed)
Received Fax RX request from  Pharmacy -  [facility] [phone] [address] [address], [place] - [address] AT [facility] [address] Phone:  [phone]  Fax:  [phone]       Medication - amLODipine (NORVASC) 5 MG tablet    Last Refill - 02/10/19  Last OV - 01/25/19  Last CPE - 01/25/19  Next Appointment - 07/22/2019

## 2019-05-10 ENCOUNTER — Other Ambulatory Visit: Payer: Self-pay | Admitting: Family Medicine

## 2019-05-10 DIAGNOSIS — Z20822 Contact with and (suspected) exposure to covid-19: Secondary | ICD-10-CM

## 2019-05-11 ENCOUNTER — Telehealth: Payer: Self-pay

## 2019-05-11 ENCOUNTER — Ambulatory Visit (INDEPENDENT_AMBULATORY_CARE_PROVIDER_SITE_OTHER): Payer: Self-pay | Admitting: Internal Medicine

## 2019-05-11 ENCOUNTER — Ambulatory Visit: Payer: BC Managed Care – PPO | Attending: Internal Medicine

## 2019-05-11 ENCOUNTER — Encounter: Payer: Self-pay | Admitting: Internal Medicine

## 2019-05-11 ENCOUNTER — Other Ambulatory Visit: Payer: Self-pay | Admitting: Family Medicine

## 2019-05-11 VITALS — Temp 97.6°F | Wt 235.0 lb

## 2019-05-11 DIAGNOSIS — Z20822 Contact with and (suspected) exposure to covid-19: Secondary | ICD-10-CM | POA: Diagnosis not present

## 2019-05-11 DIAGNOSIS — F411 Generalized anxiety disorder: Secondary | ICD-10-CM

## 2019-05-11 DIAGNOSIS — G44201 Tension-type headache, unspecified, intractable: Secondary | ICD-10-CM

## 2019-05-11 DIAGNOSIS — Z6841 Body Mass Index (BMI) 40.0 and over, adult: Secondary | ICD-10-CM

## 2019-05-11 DIAGNOSIS — I10 Essential (primary) hypertension: Secondary | ICD-10-CM

## 2019-05-11 MED ORDER — HYDROCODONE-ACETAMINOPHEN 5-325 MG PO TABS: 1.0000 | ORAL_TABLET | Freq: Four times a day (QID) | ORAL | 0 refills | Status: DC | PRN

## 2019-05-11 NOTE — Telephone Encounter (Signed)
Patient called was exposed to someone with COVID on Friday, she has nausea, headache, loose stools and sinus pressure her temperature was normal, O2 levels 96-97. She can not go back to work until she gets a COVID test, I have advised to call [facility] COVID testing clinic and make and appointment I have scheduled her for a virtual with you this afternoon. She will go get tested before her virtual visit.

## 2019-05-11 NOTE — Telephone Encounter (Signed)
Agree with this disposition.

## 2019-05-12 ENCOUNTER — Encounter: Payer: Self-pay | Admitting: Internal Medicine

## 2019-05-12 LAB — NOVEL CORONAVIRUS, NAA: SARS-CoV-2, NAA: NOT DETECTED

## 2019-05-12 NOTE — Telephone Encounter (Signed)
Andrea Thompson called to say her COVID-19 test was negative, even though she still has headache and loose stools she would like to get a note for her to go back to work.

## 2019-05-12 NOTE — Telephone Encounter (Signed)
After speaking with Dr Lenord Fellers, she does not feel like patient is ready to go back to work. I called patient to let her know what Dr Lenord Fellers said, that she could still be contagious and that she should be 3 days asymptomatic before returning to work after a COVID exposure. Patient verbalized understanding.

## 2019-05-13 ENCOUNTER — Encounter: Payer: Self-pay | Admitting: Internal Medicine

## 2019-05-13 NOTE — Telephone Encounter (Signed)
Letter completed and in mychart.

## 2019-05-13 NOTE — Telephone Encounter (Signed)
Can you prepare a note for her in My Chart that she can return to work tomorrow. Covid test was negative and she is feeling better.

## 2019-05-13 NOTE — Telephone Encounter (Signed)
Patient called this morning, she woke up without a headache, BP 128/77, O2 96, temperature 96.9. She hasn't been to the bathroom this morning, she said she feels "a lot better". She wants to know if she can come by tomorrow to get a note so she can return to work tomorrow.

## 2019-05-15 NOTE — Progress Notes (Signed)
   Subjective:    Patient ID: Andrea Thompson, female    DOB: 1966-02-07, 54 y.o.   MRN: [id number]  HPI 54 year old Female seen today by interactive audio [employer] video telecommunications due to the coronavirus pandemic.  She is identified using 2 identifiers as Andrea Thompson a patient in this practice.  She is agreeable to visit in this format today.  She is seen in her home.  Patient had close exposure to coworker who has been diagnosed with COVID-19.  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  Says a coworker was not always wearing mask over her nose at work.  Patient is anxious about this exposure.  Subsequently developed headache which she has had now for 2 days.  Has had no fever.  Able to eat and drink.  Is felt a bit dizzy.  Has had some nausea and diarrhea.  Has been aching.  No dysgeusia.  Has no cough or congestion.  Main complaint is headache and diarrhea.    Review of Systems see above     Objective:   Physical Exam  Seen virtually and looks a bit fatigued and slightly pale.  Is worried.      Assessment & Plan:  Close exposure to COVID-19 virus  Plan: Await test results.  Offered Zofran for nausea but she declined.  Call in hydrocodone APAP 5/501 p.o. daily 8 hours as needed headache.  Take with food.  Rest and drink plenty of fluids.  Addendum: Patient has tested negative for COVID-19 on January 26.  Time spent with patient during interview, reviewing test results and preparing note to return to work 20 minutes.

## 2019-05-15 NOTE — Patient Instructions (Addendum)
Continue to quarantine at home until test result is back.  Take hydrocodone APAP sparingly for headache.  Rest and drink plenty of fluids.  Call if symptoms worsen.  Addendum: Has tested negative for COVID-19.  Headache has improved and note will be prepared for her to return to work.

## 2019-05-30 ENCOUNTER — Other Ambulatory Visit: Payer: Self-pay | Admitting: Internal Medicine

## 2019-06-09 ENCOUNTER — Other Ambulatory Visit: Payer: Self-pay | Admitting: Internal Medicine

## 2019-07-11 ENCOUNTER — Other Ambulatory Visit: Payer: Self-pay | Admitting: Internal Medicine

## 2019-07-22 ENCOUNTER — Other Ambulatory Visit: Payer: Self-pay

## 2019-07-22 ENCOUNTER — Other Ambulatory Visit: Payer: BC Managed Care – PPO | Admitting: Internal Medicine

## 2019-07-22 DIAGNOSIS — E785 Hyperlipidemia, unspecified: Secondary | ICD-10-CM

## 2019-07-22 LAB — HEPATIC FUNCTION PANEL
AG Ratio: 1.6 (calc) (ref 1.0–2.5)
ALT: 20 U/L (ref 6–29)
AST: 12 U/L (ref 10–35)
Albumin: 4.2 g/dL (ref 3.6–5.1)
Alkaline phosphatase (APISO): 82 U/L (ref 37–153)
Bilirubin, Direct: 0.1 mg/dL (ref 0.0–0.2)
Globulin: 2.7 g/dL (calc) (ref 1.9–3.7)
Indirect Bilirubin: 0.4 mg/dL (calc) (ref 0.2–1.2)
Total Bilirubin: 0.5 mg/dL (ref 0.2–1.2)
Total Protein: 6.9 g/dL (ref 6.1–8.1)

## 2019-07-22 LAB — LIPID PANEL
Cholesterol: 113 mg/dL (ref <200)
HDL: 42 mg/dL — ABNORMAL LOW (ref >=50)
LDL Cholesterol (Calc): 57 mg/dL (calc)
Non-HDL Cholesterol (Calc): 71 mg/dL (calc) (ref <130)
Total CHOL/HDL Ratio: 2.7 (calc) (ref <5.0)
Triglycerides: 64 mg/dL (ref <150)

## 2019-07-24 ENCOUNTER — Other Ambulatory Visit: Payer: Self-pay | Admitting: Internal Medicine

## 2019-07-27 ENCOUNTER — Ambulatory Visit (INDEPENDENT_AMBULATORY_CARE_PROVIDER_SITE_OTHER): Payer: BC Managed Care – PPO | Admitting: Internal Medicine

## 2019-07-27 ENCOUNTER — Other Ambulatory Visit: Payer: Self-pay

## 2019-07-27 ENCOUNTER — Encounter: Payer: Self-pay | Admitting: Internal Medicine

## 2019-07-27 VITALS — BP 140/80 | HR 86 | Temp 98.0°F | Ht 61.0 in | Wt 236.0 lb

## 2019-07-27 DIAGNOSIS — Z6841 Body Mass Index (BMI) 40.0 and over, adult: Secondary | ICD-10-CM | POA: Diagnosis not present

## 2019-07-27 DIAGNOSIS — F329 Major depressive disorder, single episode, unspecified: Secondary | ICD-10-CM

## 2019-07-27 DIAGNOSIS — F32A Depression, unspecified: Secondary | ICD-10-CM

## 2019-07-27 DIAGNOSIS — F4541 Pain disorder exclusively related to psychological factors: Secondary | ICD-10-CM | POA: Diagnosis not present

## 2019-07-27 DIAGNOSIS — K219 Gastro-esophageal reflux disease without esophagitis: Secondary | ICD-10-CM

## 2019-07-27 DIAGNOSIS — I10 Essential (primary) hypertension: Secondary | ICD-10-CM

## 2019-07-27 DIAGNOSIS — E786 Lipoprotein deficiency: Secondary | ICD-10-CM | POA: Diagnosis not present

## 2019-07-27 DIAGNOSIS — F419 Anxiety disorder, unspecified: Secondary | ICD-10-CM

## 2019-07-27 NOTE — Progress Notes (Signed)
   Subjective:    Patient ID: [name], female    DOB: October 27, 1965, 54 y.o.   MRN: [id number]  HPI Pleasant 54 year old Female for 6 month recheck on HTN.  History of hypokalemia.  She takes calcium supplements.  She takes Lipitor for hyperlipidemia.  Lipids are within normal limits.  Zoloft prescribed for anxiety and depression.  Anxiety and depression symptoms are stable with this medication.  History of migraine headaches, GE reflux and vitamin D deficiency.  Review of labs shows a low HDL at 42.  Triglycerides 64, LDL cholesterol 57, total cholesterol 113.  Liver functions are normal.  PSA was not checked today.  Able to afford  Bystolic at present time with help of pharmacy discount card.  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  Review of Systems no new complaints.  Recently had second Covid vaccine.       Objective:   Physical Exam Blood pressure 140/80, pulse 86 temperature 98 degrees orally pulse oximetry 98% weight 236 pounds BMI 44.59  Skin warm and dry.  No thyromegaly.  No carotid bruits.  Chest clear to auscultation.  Cardiac exam regular rate and rhythm normal S1 and S2 without murmurs or gallops.  No lower extremity pitting edema.  Affect thought and judgment are normal.       Assessment & Plan:  Essential hypertension-stable on current regimen.  She will continue to monitor and let me know if elevated.  Hypokalemia-continue potassium supplement.  Will recheck in 6 months.  Anxiety and depression continue Zoloft.  Symptoms are stable and under good control.  Obesity-BMI is 44.59.  Continue to encourage diet exercise and weight loss.  History of ventricular bigeminy in 2012.  Stress echocardiogram was normal.  Treated with Bystolic  History of GE reflux treated with PPI   Family history of heart disease. Had Myoview study in 2018 for evaluation of chest pain which showed a defect in mid anterior, apical anterior and apex location. Thought to  have probable breast attenuation. Wall motion normal. EF 55-60%  Hyperlipidemia- saw Cardiology 2018. Has low HDL and Family hx heart disease in mother. Is on statin.Cardiology stated goal was to keep LDL less than 70 with statin. LDL improved from October 2020 when it was 101 and is now 92. HDL has improved from 32 in 2018 to 42.  Plan: RTC in 6 months for CPE and fasting labs. Has received Covid-19 vaccines. Gets annual flu vaccine through employment.Mammogram ordered October 2020 and has not been done yet.  Plan: Continue current medications.  Encourage diet exercise and weight loss.  Return in 6 months for health maintenance exam.

## 2019-07-27 NOTE — Patient Instructions (Addendum)
It was a pleasure to see you today.  Continue current medications.  Try to work on diet exercise and weight loss.  Follow-up with health maintenance exam in 6 months and fasting labs.  No change in medication regimen. Have mammogram.

## 2019-09-08 ENCOUNTER — Other Ambulatory Visit: Payer: Self-pay

## 2019-09-08 MED ORDER — DIETHYLPROPION HCL ER 75 MG PO TB24: 1.0000 | ORAL_TABLET | Freq: Every day | ORAL | 1 refills | Status: DC

## 2019-09-08 MED ORDER — SERTRALINE HCL 100 MG PO TABS: 100.0000 mg | ORAL_TABLET | Freq: Every day | ORAL | 1 refills | Status: DC

## 2019-10-31 ENCOUNTER — Other Ambulatory Visit: Payer: Self-pay | Admitting: Internal Medicine

## 2020-01-11 ENCOUNTER — Other Ambulatory Visit: Payer: Self-pay | Admitting: Internal Medicine

## 2020-01-25 ENCOUNTER — Other Ambulatory Visit: Payer: Self-pay

## 2020-01-25 ENCOUNTER — Other Ambulatory Visit: Payer: BC Managed Care – PPO | Admitting: Internal Medicine

## 2020-01-25 DIAGNOSIS — E786 Lipoprotein deficiency: Secondary | ICD-10-CM | POA: Diagnosis not present

## 2020-01-25 DIAGNOSIS — F419 Anxiety disorder, unspecified: Secondary | ICD-10-CM | POA: Diagnosis not present

## 2020-01-25 DIAGNOSIS — F32A Depression, unspecified: Secondary | ICD-10-CM

## 2020-01-25 DIAGNOSIS — I10 Essential (primary) hypertension: Secondary | ICD-10-CM | POA: Diagnosis not present

## 2020-01-25 DIAGNOSIS — Z1321 Encounter for screening for nutritional disorder: Secondary | ICD-10-CM

## 2020-01-25 DIAGNOSIS — K589 Irritable bowel syndrome without diarrhea: Secondary | ICD-10-CM

## 2020-01-25 DIAGNOSIS — Z0000 Encounter for general adult medical examination without abnormal findings: Secondary | ICD-10-CM

## 2020-01-25 DIAGNOSIS — K219 Gastro-esophageal reflux disease without esophagitis: Secondary | ICD-10-CM

## 2020-01-25 DIAGNOSIS — Z1329 Encounter for screening for other suspected endocrine disorder: Secondary | ICD-10-CM

## 2020-01-26 LAB — CBC WITH DIFFERENTIAL/PLATELET
Absolute Monocytes: 412 cells/uL (ref 200–950)
Basophils Absolute: 71 cells/uL (ref 0–200)
Basophils Relative: 1 %
Eosinophils Absolute: 121 cells/uL (ref 15–500)
Eosinophils Relative: 1.7 %
HCT: 37.4 % (ref 35.0–45.0)
Hemoglobin: 12.8 g/dL (ref 11.7–15.5)
Lymphs Abs: 2379 cells/uL (ref 850–3900)
MCH: 28.8 pg (ref 27.0–33.0)
MCHC: 34.2 g/dL (ref 32.0–36.0)
MCV: 84 fL (ref 80.0–100.0)
MPV: 9.9 fL (ref 7.5–12.5)
Monocytes Relative: 5.8 %
Neutro Abs: 4118 cells/uL (ref 1500–7800)
Neutrophils Relative %: 58 %
Platelets: 255 10*3/uL (ref 140–400)
RBC: 4.45 10*6/uL (ref 3.80–5.10)
RDW: 13.7 % (ref 11.0–15.0)
Total Lymphocyte: 33.5 %
WBC: 7.1 10*3/uL (ref 3.8–10.8)

## 2020-01-26 LAB — COMPLETE METABOLIC PANEL WITH GFR
AG Ratio: 1.7 (calc) (ref 1.0–2.5)
ALT: 18 U/L (ref 6–29)
AST: 13 U/L (ref 10–35)
Albumin: 4.3 g/dL (ref 3.6–5.1)
Alkaline phosphatase (APISO): 94 U/L (ref 37–153)
BUN: 12 mg/dL (ref 7–25)
CO2: 28 mmol/L (ref 20–32)
Calcium: 9.3 mg/dL (ref 8.6–10.4)
Chloride: 105 mmol/L (ref 98–110)
Creat: 0.69 mg/dL (ref 0.50–1.05)
GFR, Est African American: 115 mL/min/{1.73_m2} (ref >=60)
GFR, Est Non African American: 99 mL/min/{1.73_m2} (ref >=60)
Globulin: 2.6 g/dL (calc) (ref 1.9–3.7)
Glucose, Bld: 99 mg/dL (ref 65–99)
Potassium: 4.3 mmol/L (ref 3.5–5.3)
Sodium: 142 mmol/L (ref 135–146)
Total Bilirubin: 0.6 mg/dL (ref 0.2–1.2)
Total Protein: 6.9 g/dL (ref 6.1–8.1)

## 2020-01-26 LAB — LIPID PANEL
Cholesterol: 108 mg/dL (ref <200)
HDL: 39 mg/dL — ABNORMAL LOW (ref >=50)
LDL Cholesterol (Calc): 55 mg/dL (calc)
Non-HDL Cholesterol (Calc): 69 mg/dL (calc) (ref <130)
Total CHOL/HDL Ratio: 2.8 (calc) (ref <5.0)
Triglycerides: 68 mg/dL (ref <150)

## 2020-01-26 LAB — TSH: TSH: 2.54 mIU/L

## 2020-01-27 ENCOUNTER — Other Ambulatory Visit: Payer: Self-pay

## 2020-01-27 ENCOUNTER — Encounter: Payer: Self-pay | Admitting: Internal Medicine

## 2020-01-27 ENCOUNTER — Ambulatory Visit (INDEPENDENT_AMBULATORY_CARE_PROVIDER_SITE_OTHER): Payer: BC Managed Care – PPO | Admitting: Internal Medicine

## 2020-01-27 VITALS — BP 120/80 | HR 70 | Ht 61.0 in | Wt 242.0 lb

## 2020-01-27 DIAGNOSIS — J301 Allergic rhinitis due to pollen: Secondary | ICD-10-CM

## 2020-01-27 DIAGNOSIS — I10 Essential (primary) hypertension: Secondary | ICD-10-CM | POA: Diagnosis not present

## 2020-01-27 DIAGNOSIS — Z0000 Encounter for general adult medical examination without abnormal findings: Secondary | ICD-10-CM

## 2020-01-27 DIAGNOSIS — F32A Depression, unspecified: Secondary | ICD-10-CM

## 2020-01-27 DIAGNOSIS — Z6841 Body Mass Index (BMI) 40.0 and over, adult: Secondary | ICD-10-CM

## 2020-01-27 DIAGNOSIS — M19042 Primary osteoarthritis, left hand: Secondary | ICD-10-CM

## 2020-01-27 DIAGNOSIS — E785 Hyperlipidemia, unspecified: Secondary | ICD-10-CM

## 2020-01-27 DIAGNOSIS — K219 Gastro-esophageal reflux disease without esophagitis: Secondary | ICD-10-CM

## 2020-01-27 DIAGNOSIS — Z8249 Family history of ischemic heart disease and other diseases of the circulatory system: Secondary | ICD-10-CM

## 2020-01-27 DIAGNOSIS — M19041 Primary osteoarthritis, right hand: Secondary | ICD-10-CM

## 2020-01-27 DIAGNOSIS — F419 Anxiety disorder, unspecified: Secondary | ICD-10-CM

## 2020-01-27 DIAGNOSIS — Z8669 Personal history of other diseases of the nervous system and sense organs: Secondary | ICD-10-CM

## 2020-01-27 DIAGNOSIS — E786 Lipoprotein deficiency: Secondary | ICD-10-CM

## 2020-01-27 DIAGNOSIS — F4541 Pain disorder exclusively related to psychological factors: Secondary | ICD-10-CM

## 2020-01-27 LAB — POCT URINALYSIS DIPSTICK
Appearance: NEGATIVE
Bilirubin, UA: NEGATIVE
Blood, UA: NEGATIVE
Glucose, UA: NEGATIVE
Ketones, UA: NEGATIVE
Leukocytes, UA: NEGATIVE
Nitrite, UA: NEGATIVE
Odor: NEGATIVE
Protein, UA: NEGATIVE
Spec Grav, UA: 1.01 (ref 1.010–1.025)
Urobilinogen, UA: 0.2 E.U./dL
pH, UA: 6.5 (ref 5.0–8.0)

## 2020-01-27 MED ORDER — DIETHYLPROPION HCL ER 75 MG PO TB24: 1.0000 | ORAL_TABLET | Freq: Every day | ORAL | 1 refills | Status: DC

## 2020-01-27 NOTE — Patient Instructions (Signed)
Continue to work on diet and exercise. Tenuate refilled x 6 months. Please have mammogram. It was a pleasure to see you today.

## 2020-01-27 NOTE — Progress Notes (Signed)
Subjective:    Patient ID: Andrea Thompson, female    DOB: [birth year], 54 y.o.   MRN: [id number]  HPI 54 year old Female for health maintenance exam and evaluation of medical issues.  She has a history of hypertension which has been labile at times.  She takes potassium supplement for hypokalemia.  She takes Lipitor for hyperlipidemia.  Zoloft has been prescribed for anxiety and depression.  She had appendectomy at [facility] in February 2020.  In February 2016 she lost 33 pounds with diet and exercise.  Unfortunately, has gained weight.  Mother remains in a nursing home.  She requires a lot of attention.  Patient continues to work full-time at [employer].  Patient resides in [facility].  Sometimes she has to work on weekends.  She has history of ventricular bigeminy and palpitations which are well controlled with Bystolic.  Hypertension is longstanding.  In February 2012 she was found to have ventricular bigeminy during evaluation in the emergency department for chest pain and palpitations.  She had a stress echocardiogram that was normal.  She was reassured that she was at low risk for coronary disease.  History of migraine headaches, irritable bowel syndrome, allergic rhinitis, GE reflux, anxiety and depression.  History of hysterectomy without oophorectomy 2002 for menorrhagia.  Social history: She is married and has a GED degree.  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  Husband works for [employer].  She previously smoked a pack of cigarettes daily for some 19 years but stopped in 2008.  She has 1 son and 2 daughters.  Family history: Mother with history of coronary artery disease and angioplasty at age 43.  Had CABG at age 58.  Mother subsequently had MI at age 60.  Mother is debilitated and apparently has had stroke now living in nursing facility.  Formerly lived with the patient.  1 brother in good health.  Father died of lung  cancer November 20, 2002.  Grandfather with history of CABG.    Review of Systems fatigue from working lots of hours during the pandemic.  Situational stress with mother.     Objective:   Physical Exam Blood pressure 120/80, pulse 70 regular pulse oximetry 98% weight 222 pounds BMI 45.73 height 5 feet 1 inches  Skin: Warm and dry.  Nodes: None.  TMs are clear.  Pharynx is clear.  Neck is supple without JVD thyromegaly or carotid bruits.  Chest is clear to auscultation.  Cardiac exam regular rate and rhythm normal S1 and S2 without murmurs.  Abdomen is soft nondistended without hepatosplenomegaly masses or tenderness.  She is status post hysterectomy.  Bimanual is normal.  No lower extremity pitting edema.  Breast without masses.  Neuro intact without focal deficits.  Affect felt judgment are normal.  Bilateral Heberden's and Bouchard's nodes       Assessment & Plan:  Essential hypertension stable on current regimen  BMI 45.73-Tenuate refilled she has had some success with this in the past  Situational stress with mother  Hypokalemia treated with potassium supplement  History of irritable bowel syndrome  History of migraine headaches.  History of anxiety depression treated with Zoloft and stable  History of allergic rhinitis and has proair inhaler as needed for wheezing  History of ventricular bigeminy treated with diastolic  History of GE reflux treated with PPI  Bilateral hand osteoarthritis  Plan: Encourage diet exercise and weight loss Tenuate refilled.  Blood pressure stable on  current regimen.  Continue Zoloft and PPI.  Continue Lipitor 40 mg daily.  Continue amlodipine diastolic and HCTZ for hypertension.  Follow-up in 1 year or as needed.  Monitor blood pressure at home.

## 2020-02-04 ENCOUNTER — Other Ambulatory Visit: Payer: Self-pay | Admitting: Internal Medicine

## 2020-03-14 ENCOUNTER — Ambulatory Visit: Payer: BC Managed Care – PPO | Attending: Internal Medicine

## 2020-03-14 ENCOUNTER — Other Ambulatory Visit: Payer: Self-pay

## 2020-03-14 ENCOUNTER — Other Ambulatory Visit: Payer: Self-pay | Admitting: Internal Medicine

## 2020-03-14 ENCOUNTER — Other Ambulatory Visit (HOSPITAL_BASED_OUTPATIENT_CLINIC_OR_DEPARTMENT_OTHER): Payer: Self-pay | Admitting: Internal Medicine

## 2020-03-14 DIAGNOSIS — Z23 Encounter for immunization: Secondary | ICD-10-CM

## 2020-03-14 MED ORDER — DIETHYLPROPION HCL ER 75 MG PO TB24
1.0000 | ORAL_TABLET | Freq: Every day | ORAL | 1 refills | Status: DC
Start: 2020-03-14 — End: 2021-07-10

## 2020-03-14 MED FILL — PFIZER-BIONTECH COVID-19 VA: 30 | 1 days supply | Qty: 0 | Fill #0

## 2020-03-14 NOTE — Progress Notes (Signed)
   Covid-19 Vaccination Clinic  Name:  IRETTA MANGRUM    MRN: [id number] DOB: 07/14/1965  03/14/2020  Ms. Bronaugh was observed post Covid-19 immunization for 15 minutes without incident. She was provided with Vaccine Information Sheet and instruction to access the V-Safe system.   Ms. Fitch was instructed to call 911 with any severe reactions post vaccine: Marland Kitchen Difficulty breathing  . Swelling of face and throat  . A fast heartbeat  . A bad rash all over body  . Dizziness and weakness   Immunizations Administered    Name Date Dose VIS Date Route   Pfizer COVID-19 Vaccine 03/14/2020  2:23 PM 0.3 mL 02/02/2020 Intramuscular   Manufacturer: [employer], [employer]   Lot: [id number]   NDC: [address]-4

## 2020-04-11 DIAGNOSIS — Z03818 Encounter for observation for suspected exposure to other biological agents ruled out: Secondary | ICD-10-CM | POA: Diagnosis not present

## 2020-04-15 LAB — HM COLONOSCOPY

## 2020-04-17 ENCOUNTER — Telehealth: Payer: Self-pay | Admitting: Internal Medicine

## 2020-04-17 ENCOUNTER — Encounter: Payer: Self-pay | Admitting: Internal Medicine

## 2020-04-17 ENCOUNTER — Telehealth: Payer: Self-pay

## 2020-04-17 ENCOUNTER — Telehealth (INDEPENDENT_AMBULATORY_CARE_PROVIDER_SITE_OTHER): Payer: BC Managed Care – PPO | Admitting: Internal Medicine

## 2020-04-17 DIAGNOSIS — Z538 Procedure and treatment not carried out for other reasons: Secondary | ICD-10-CM

## 2020-04-17 DIAGNOSIS — B3731 Acute candidiasis of vulva and vagina: Secondary | ICD-10-CM

## 2020-04-17 DIAGNOSIS — B373 Candidiasis of vulva and vagina: Secondary | ICD-10-CM

## 2020-04-17 MED ORDER — FLUCONAZOLE 150 MG PO TABS: 150.0000 mg | ORAL_TABLET | Freq: Once | ORAL | 1 refills | Status: AC

## 2020-04-17 NOTE — Telephone Encounter (Signed)
scheduled

## 2020-04-17 NOTE — Telephone Encounter (Signed)
Virtual visit 

## 2020-04-17 NOTE — Telephone Encounter (Signed)
Patient called with complaint of vaginal discomfort onset Friday. Has irritation in vaginal area with burning sensation. No dysuria. Urine is clear. No vaginal discharge. Tried Monistat one time with some relief. Main complaint is vaginal burning and burning at end of stream. No fever or chills. Cannot get off work to come to office today.

## 2020-04-17 NOTE — Telephone Encounter (Signed)
Patient called is requesting something for a vaginal yeast infection she has vaginal burning no discharge going on since Friday. She said she could do a video visit if necessary.

## 2020-04-24 ENCOUNTER — Encounter: Payer: Self-pay | Admitting: Internal Medicine

## 2020-04-24 ENCOUNTER — Ambulatory Visit (INDEPENDENT_AMBULATORY_CARE_PROVIDER_SITE_OTHER): Payer: BC Managed Care – PPO | Admitting: Internal Medicine

## 2020-04-24 ENCOUNTER — Other Ambulatory Visit: Payer: Self-pay

## 2020-04-24 VITALS — BP 110/70 | HR 88 | Temp 97.8°F | Ht 61.0 in | Wt 242.0 lb

## 2020-04-24 DIAGNOSIS — N949 Unspecified condition associated with female genital organs and menstrual cycle: Secondary | ICD-10-CM | POA: Diagnosis not present

## 2020-04-24 MED ORDER — CLOTRIMAZOLE-BETAMETHASONE 1-0.05 % EX CREA: 1.0000 "application " | TOPICAL_CREAM | Freq: Two times a day (BID) | CUTANEOUS | 2 refills | Status: DC

## 2020-04-24 NOTE — Patient Instructions (Addendum)
Lotrisone cream to labia twice daily for 7-10 days.  Call if not improving.

## 2020-04-24 NOTE — Progress Notes (Signed)
   Subjective:    Patient ID: Andrea Thompson, female    DOB: 1965-09-09, 55 y.o.   MRN: [id number]  HPI 55 year old Female seen with complaint of burning when urinating. Had symptoms starting December 31st. Spoke with her by phone on January 3rd. Had tried Monistat. Main complaint is irritated labia and burning at end of stream. No discharge. No fever or chills.I prescribed Diflucan 150 mg tablet on January 3rd but symptoms did not improve. Here for in-person visit and evaluation.    Review of Systems see above.     Objective:   Physical Exam  BP 110/70 pulse 88 T 97.8 degrees pulse ox 98% weight 242 pounds. BMI 45.73 Inner labia appear erythematous bilaterally.  No vaginal discharge.  Normal female external genitalia.     Assessment & Plan:  Labial burning  Labial irritation  Plan: Lotrisone cream to the labial area twice daily for 7 to 10 days.  Call if not improving.

## 2020-05-15 NOTE — Patient Instructions (Signed)
appt cancelled

## 2020-05-15 NOTE — Progress Notes (Signed)
Appt cancelled

## 2020-06-12 ENCOUNTER — Other Ambulatory Visit: Payer: Self-pay | Admitting: Internal Medicine

## 2020-06-12 DIAGNOSIS — Z1231 Encounter for screening mammogram for malignant neoplasm of breast: Secondary | ICD-10-CM

## 2020-06-21 ENCOUNTER — Other Ambulatory Visit: Payer: Self-pay | Admitting: Internal Medicine

## 2020-07-11 ENCOUNTER — Other Ambulatory Visit: Payer: Self-pay

## 2020-07-11 ENCOUNTER — Ambulatory Visit (INDEPENDENT_AMBULATORY_CARE_PROVIDER_SITE_OTHER): Payer: BC Managed Care – PPO | Admitting: Internal Medicine

## 2020-07-11 ENCOUNTER — Encounter: Payer: Self-pay | Admitting: Internal Medicine

## 2020-07-11 ENCOUNTER — Telehealth: Payer: Self-pay | Admitting: Internal Medicine

## 2020-07-11 VITALS — BP 120/80 | HR 68 | Temp 98.2°F | Ht 61.0 in | Wt 248.0 lb

## 2020-07-11 DIAGNOSIS — I10 Essential (primary) hypertension: Secondary | ICD-10-CM

## 2020-07-11 DIAGNOSIS — J029 Acute pharyngitis, unspecified: Secondary | ICD-10-CM

## 2020-07-11 DIAGNOSIS — E785 Hyperlipidemia, unspecified: Secondary | ICD-10-CM

## 2020-07-11 DIAGNOSIS — R52 Pain, unspecified: Secondary | ICD-10-CM | POA: Diagnosis not present

## 2020-07-11 DIAGNOSIS — K219 Gastro-esophageal reflux disease without esophagitis: Secondary | ICD-10-CM | POA: Diagnosis not present

## 2020-07-11 DIAGNOSIS — M791 Myalgia, unspecified site: Secondary | ICD-10-CM

## 2020-07-11 LAB — POCT RAPID STREP A (OFFICE): Rapid Strep A Screen: NEGATIVE

## 2020-07-11 LAB — POCT INFLUENZA A/B
Influenza A, POC: NEGATIVE
Influenza B, POC: NEGATIVE

## 2020-07-11 MED ORDER — AZITHROMYCIN 250 MG PO TABS: ORAL_TABLET | ORAL | 0 refills | Status: DC

## 2020-07-11 MED ORDER — HYDROCODONE-ACETAMINOPHEN 10-325 MG PO TABS: 1.0000 | ORAL_TABLET | Freq: Three times a day (TID) | ORAL | 0 refills | Status: AC | PRN

## 2020-07-11 NOTE — Progress Notes (Signed)
   Subjective:    Patient ID: Andrea Thompson, female    DOB: 31-Dec-1965, 55 y.o.   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  Call today complaining of sore throat, headache, diarrhea and fatigue.  She did a home Covid test that was negative.  Has had 3 COVID vaccines and an influenza vaccine in October 2021.  Rapid strep screen is negative.  Nasal swab rapid influenza A/B testing is negative.  COVID-19 and respiratory virus panel tests were obtained.  Review of Systems no shaking chills, no significant cough  Could have been exposed to COVID-19 at nursing home where her mother resides or perhaps through work  History of hypertension treated with amlodipine HCTZ and Bystolic.  GE reflux treated with PPI  History of hyperlipidemia treated with Lipitor.     Objective:   Physical Exam  Blood pressure 120/80 pulse 68 temperature 98.2 degrees orally pulse oximetry 98% weight 248 pounds BMI 46.86  Skin warm and dry.  Looks fatigued.  Not heard to be coughing.  Rapid strep screen obtained.  TMs clear.  Neck supple.  Chest clear to auscultation.      Assessment & Plan:  Viral syndrome-rule out COVID-19  Plan: Refill hydrocodone APAP 10/325 to take 1/2 to 1 tablet 8 hours as needed for myalgias.  Zithromax Z-PAK 2 p.o. day 1 followed by 1 p.o. days 2 through 5.  Stay at home for the remainder of the week and quarantine pending test results.  May take Tylenol for fever if not taking hydrocodone APAP.  Stay well-hydrated and rest.

## 2020-07-11 NOTE — Patient Instructions (Addendum)
Stay at home for the remainder of the week and quarantine.  Rapid flu and rapid.  Take Zithromax Z-PAK as directed.  Take hydrocodone APAP sparingly for musculoskeletal pain.  Covid 19 and respiratory virus panels are pending.

## 2020-07-11 NOTE — Telephone Encounter (Signed)
Pt took at home test and she said it came back negative

## 2020-07-11 NOTE — Telephone Encounter (Signed)
Does she have a home test? We can do car visit or she can get tested at a pharmacy.

## 2020-07-11 NOTE — Telephone Encounter (Signed)
Ov this afternoon

## 2020-07-11 NOTE — Telephone Encounter (Signed)
Pt called requesting appt she said she thinks she needs a covid test, she has a sore throat, headache, gi issues and is really tired. What is the best course of action? Callback [phone]

## 2020-07-12 LAB — RESPIRATORY VIRUS PANEL

## 2020-07-12 LAB — SARS-COV-2 RNA,(COVID-19) QUALITATIVE NAAT: SARS CoV2 RNA: NOT DETECTED

## 2020-07-24 ENCOUNTER — Other Ambulatory Visit: Payer: Self-pay | Admitting: Internal Medicine

## 2020-08-01 ENCOUNTER — Other Ambulatory Visit: Payer: Self-pay

## 2020-08-01 ENCOUNTER — Ambulatory Visit
Admission: RE | Admit: 2020-08-01 | Discharge: 2020-08-01 | Disposition: A | Discharge status: Home / Self Care | Payer: BC Managed Care – PPO | Source: Ambulatory Visit | Attending: Internal Medicine | Admitting: Internal Medicine

## 2020-08-01 DIAGNOSIS — Z1231 Encounter for screening mammogram for malignant neoplasm of breast: Secondary | ICD-10-CM

## 2020-09-12 DIAGNOSIS — G4710 Hypersomnia, unspecified: Secondary | ICD-10-CM | POA: Diagnosis not present

## 2020-09-12 DIAGNOSIS — Z6841 Body Mass Index (BMI) 40.0 and over, adult: Secondary | ICD-10-CM | POA: Diagnosis not present

## 2020-09-12 DIAGNOSIS — R0683 Snoring: Secondary | ICD-10-CM | POA: Diagnosis not present

## 2020-09-26 DIAGNOSIS — G4710 Hypersomnia, unspecified: Secondary | ICD-10-CM | POA: Diagnosis not present

## 2020-09-27 DIAGNOSIS — Z87891 Personal history of nicotine dependence: Secondary | ICD-10-CM | POA: Diagnosis not present

## 2020-09-27 DIAGNOSIS — R0683 Snoring: Secondary | ICD-10-CM | POA: Diagnosis not present

## 2020-09-27 DIAGNOSIS — G4733 Obstructive sleep apnea (adult) (pediatric): Secondary | ICD-10-CM | POA: Diagnosis not present

## 2020-09-27 DIAGNOSIS — G4710 Hypersomnia, unspecified: Secondary | ICD-10-CM | POA: Diagnosis not present

## 2020-10-09 ENCOUNTER — Telehealth: Payer: Self-pay | Admitting: Internal Medicine

## 2020-10-09 DIAGNOSIS — Z20828 Contact with and (suspected) exposure to other viral communicable diseases: Secondary | ICD-10-CM | POA: Diagnosis not present

## 2020-10-09 DIAGNOSIS — K529 Noninfective gastroenteritis and colitis, unspecified: Secondary | ICD-10-CM | POA: Diagnosis not present

## 2020-10-09 DIAGNOSIS — R1110 Vomiting, unspecified: Secondary | ICD-10-CM | POA: Diagnosis not present

## 2020-10-09 NOTE — Telephone Encounter (Signed)
Called patient and let her know she needs to go to an Urgent Care or Ed where labs will be back Stat. She verbalized understanding.

## 2020-10-09 NOTE — Telephone Encounter (Signed)
[name] [phone]  Yesterday afternoon Andrea Thompson came down with vomiting, diarrhea, body aches, really bad head aches , no fever, she done a home COVID test this morning that was negative. She has had 2 vaccines and a booster.

## 2020-10-18 ENCOUNTER — Other Ambulatory Visit: Payer: Self-pay | Admitting: Internal Medicine

## 2020-11-03 DIAGNOSIS — G4733 Obstructive sleep apnea (adult) (pediatric): Secondary | ICD-10-CM | POA: Diagnosis not present

## 2020-11-13 DIAGNOSIS — G4733 Obstructive sleep apnea (adult) (pediatric): Secondary | ICD-10-CM | POA: Diagnosis not present

## 2020-12-14 DIAGNOSIS — G4733 Obstructive sleep apnea (adult) (pediatric): Secondary | ICD-10-CM | POA: Diagnosis not present

## 2020-12-26 ENCOUNTER — Other Ambulatory Visit: Payer: Self-pay | Admitting: Internal Medicine

## 2021-01-09 DIAGNOSIS — Z87891 Personal history of nicotine dependence: Secondary | ICD-10-CM | POA: Diagnosis not present

## 2021-01-09 DIAGNOSIS — G4733 Obstructive sleep apnea (adult) (pediatric): Secondary | ICD-10-CM | POA: Diagnosis not present

## 2021-01-09 DIAGNOSIS — G4710 Hypersomnia, unspecified: Secondary | ICD-10-CM | POA: Diagnosis not present

## 2021-01-09 DIAGNOSIS — Z6841 Body Mass Index (BMI) 40.0 and over, adult: Secondary | ICD-10-CM | POA: Diagnosis not present

## 2021-01-13 ENCOUNTER — Other Ambulatory Visit: Payer: Self-pay | Admitting: Internal Medicine

## 2021-01-13 DIAGNOSIS — G4733 Obstructive sleep apnea (adult) (pediatric): Secondary | ICD-10-CM | POA: Diagnosis not present

## 2021-02-02 ENCOUNTER — Encounter: Payer: Self-pay | Admitting: Internal Medicine

## 2021-03-22 ENCOUNTER — Other Ambulatory Visit: Payer: Self-pay | Admitting: Internal Medicine

## 2021-04-12 ENCOUNTER — Other Ambulatory Visit: Payer: Self-pay | Admitting: Internal Medicine

## 2021-04-27 ENCOUNTER — Other Ambulatory Visit: Payer: Self-pay

## 2021-04-27 ENCOUNTER — Ambulatory Visit: Payer: Self-pay

## 2021-04-27 ENCOUNTER — Other Ambulatory Visit: Payer: Self-pay | Admitting: Family Medicine

## 2021-04-27 DIAGNOSIS — M79672 Pain in left foot: Secondary | ICD-10-CM

## 2021-06-06 ENCOUNTER — Other Ambulatory Visit: Payer: Self-pay | Admitting: Internal Medicine

## 2021-07-03 ENCOUNTER — Other Ambulatory Visit: Payer: Self-pay

## 2021-07-03 ENCOUNTER — Other Ambulatory Visit: Payer: BC Managed Care – PPO

## 2021-07-03 DIAGNOSIS — I10 Essential (primary) hypertension: Secondary | ICD-10-CM | POA: Diagnosis not present

## 2021-07-03 DIAGNOSIS — E785 Hyperlipidemia, unspecified: Secondary | ICD-10-CM

## 2021-07-03 DIAGNOSIS — R5383 Other fatigue: Secondary | ICD-10-CM

## 2021-07-04 LAB — LIPID PANEL
Cholesterol: 95 mg/dL (ref <200)
HDL: 34 mg/dL — ABNORMAL LOW (ref >=50)
LDL Cholesterol (Calc): 45 mg/dL (calc)
Non-HDL Cholesterol (Calc): 61 mg/dL (calc) (ref <130)
Total CHOL/HDL Ratio: 2.8 (calc) (ref <5.0)
Triglycerides: 84 mg/dL (ref <150)

## 2021-07-04 LAB — CBC WITH DIFFERENTIAL/PLATELET
Absolute Monocytes: 371 cells/uL (ref 200–950)
Basophils Absolute: 38 cells/uL (ref 0–200)
Basophils Relative: 0.6 %
Eosinophils Absolute: 90 cells/uL (ref 15–500)
Eosinophils Relative: 1.4 %
HCT: 38.1 % (ref 35.0–45.0)
Hemoglobin: 12.7 g/dL (ref 11.7–15.5)
Lymphs Abs: 1811 cells/uL (ref 850–3900)
MCH: 28.5 pg (ref 27.0–33.0)
MCHC: 33.3 g/dL (ref 32.0–36.0)
MCV: 85.6 fL (ref 80.0–100.0)
MPV: 10.5 fL (ref 7.5–12.5)
Monocytes Relative: 5.8 %
Neutro Abs: 4090 cells/uL (ref 1500–7800)
Neutrophils Relative %: 63.9 %
Platelets: 261 10*3/uL (ref 140–400)
RBC: 4.45 10*6/uL (ref 3.80–5.10)
RDW: 13.6 % (ref 11.0–15.0)
Total Lymphocyte: 28.3 %
WBC: 6.4 10*3/uL (ref 3.8–10.8)

## 2021-07-04 LAB — COMPLETE METABOLIC PANEL WITH GFR
AG Ratio: 1.6 (calc) (ref 1.0–2.5)
ALT: 18 U/L (ref 6–29)
AST: 12 U/L (ref 10–35)
Albumin: 4.4 g/dL (ref 3.6–5.1)
Alkaline phosphatase (APISO): 94 U/L (ref 37–153)
BUN: 13 mg/dL (ref 7–25)
CO2: 29 mmol/L (ref 20–32)
Calcium: 9.6 mg/dL (ref 8.6–10.4)
Chloride: 105 mmol/L (ref 98–110)
Creat: 0.72 mg/dL (ref 0.50–1.03)
Globulin: 2.7 g/dL (calc) (ref 1.9–3.7)
Glucose, Bld: 99 mg/dL (ref 65–99)
Potassium: 4.4 mmol/L (ref 3.5–5.3)
Sodium: 146 mmol/L (ref 135–146)
Total Bilirubin: 0.5 mg/dL (ref 0.2–1.2)
Total Protein: 7.1 g/dL (ref 6.1–8.1)
eGFR: 99 mL/min/{1.73_m2} (ref >=60)

## 2021-07-04 LAB — TSH: TSH: 2.15 mIU/L

## 2021-07-05 ENCOUNTER — Other Ambulatory Visit: Payer: BC Managed Care – PPO | Admitting: Internal Medicine

## 2021-07-06 ENCOUNTER — Other Ambulatory Visit: Payer: Self-pay | Admitting: Internal Medicine

## 2021-07-10 ENCOUNTER — Other Ambulatory Visit: Payer: Self-pay

## 2021-07-10 ENCOUNTER — Ambulatory Visit (INDEPENDENT_AMBULATORY_CARE_PROVIDER_SITE_OTHER): Payer: BC Managed Care – PPO | Admitting: Internal Medicine

## 2021-07-10 ENCOUNTER — Encounter: Payer: Self-pay | Admitting: Internal Medicine

## 2021-07-10 VITALS — BP 118/62 | HR 53 | Temp 97.5°F | Ht 62.0 in | Wt 235.0 lb

## 2021-07-10 DIAGNOSIS — G4733 Obstructive sleep apnea (adult) (pediatric): Secondary | ICD-10-CM

## 2021-07-10 DIAGNOSIS — Z6841 Body Mass Index (BMI) 40.0 and over, adult: Secondary | ICD-10-CM

## 2021-07-10 DIAGNOSIS — Z0000 Encounter for general adult medical examination without abnormal findings: Secondary | ICD-10-CM

## 2021-07-10 DIAGNOSIS — Z9989 Dependence on other enabling machines and devices: Secondary | ICD-10-CM | POA: Insufficient documentation

## 2021-07-10 DIAGNOSIS — I10 Essential (primary) hypertension: Secondary | ICD-10-CM

## 2021-07-10 DIAGNOSIS — F419 Anxiety disorder, unspecified: Secondary | ICD-10-CM

## 2021-07-10 DIAGNOSIS — Z1231 Encounter for screening mammogram for malignant neoplasm of breast: Secondary | ICD-10-CM

## 2021-07-10 DIAGNOSIS — Z8249 Family history of ischemic heart disease and other diseases of the circulatory system: Secondary | ICD-10-CM

## 2021-07-10 DIAGNOSIS — F32A Depression, unspecified: Secondary | ICD-10-CM

## 2021-07-10 DIAGNOSIS — K219 Gastro-esophageal reflux disease without esophagitis: Secondary | ICD-10-CM

## 2021-07-10 DIAGNOSIS — E786 Lipoprotein deficiency: Secondary | ICD-10-CM

## 2021-07-10 DIAGNOSIS — J301 Allergic rhinitis due to pollen: Secondary | ICD-10-CM

## 2021-07-10 DIAGNOSIS — F4541 Pain disorder exclusively related to psychological factors: Secondary | ICD-10-CM

## 2021-07-10 LAB — POCT URINALYSIS DIPSTICK
Bilirubin, UA: NEGATIVE
Blood, UA: NEGATIVE
Glucose, UA: NEGATIVE
Ketones, UA: NEGATIVE
Leukocytes, UA: NEGATIVE
Nitrite, UA: NEGATIVE
Protein, UA: NEGATIVE
Spec Grav, UA: <=1.005 — AB (ref 1.010–1.025)
Urobilinogen, UA: 0.2 E.U./dL
pH, UA: 5 (ref 5.0–8.0)

## 2021-07-10 MED ORDER — DIETHYLPROPION HCL ER 75 MG PO TB24: 75.0000 mg | ORAL_TABLET | Freq: Every day | ORAL | 1 refills | Status: DC

## 2021-07-10 NOTE — Progress Notes (Signed)
? ?Subjective:  ? ? Patient ID: Andrea Thompson, female    DOB: 13-Jan-1966, 56 y.o.   MRN: [id number] ? ?HPI  56 year old Female seen for health maintenance exam and evaluation of medical issues. ? ?She has a history of hypertension.  She takes potassium supplement for hypokalemia.  Takes Lipitor for hyperlipidemia.  Zoloft has been prescribed for anxiety and depression. ? ?Had appendectomy at [facility] in February 2020.  In February 2016 she lost 33 pounds with diet and exercise.  Unfortunately regained the weight.  Patient resides in [facility].  She is working full-time at [employer]. ? ?She has a history of migraine headaches, irritable bowel syndrome, allergic rhinitis, GE reflux, anxiety and depression. ? ?History of hysterectomy without oophorectomy 2002 for menorrhagia. ? ?History of ventricular bigeminy and palpitations which are controlled with diastolic.  Hypertension is longstanding.  Ventricular bigeminy was diagnosed in February 2012 during an evaluation in the emergency department for chest pain and palpitations.  She had a stress echocardiogram that was normal.  She was reassured then that she was at low risk for coronary disease. ? ?Social history: She is married.  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  Husband works part-time Physiological scientist.  She stopped smoking in 2008 after smoking a pack of cigarettes daily for some 19 years.  She has 1 son and 2 daughters. ? ?Family history: Mother with history of coronary artery disease and angioplasty at age 44.  Had CABG at age 66.  Mother subsequently had MI at age 16.  Mother is debilitated and has history of stroke and lives in a nursing facility.  1 brother in good health.  Father died of lung cancer Nov 22, 2002.  Grandfather with history of CABG. ? ? ? ?Review of Systems Had at home sleep study and now has CPAP device from [facility] for obstructive sleep apnea.  ? ?Has more energy with  CPAP device.Had colonoscopy  June 2020 by Dr. Loman Chroman in [facility] with follow up in 10 years. ? ? ? ?   ?Objective:  ? Physical Exam ?Blood pressure 119/62, pulse 53, pulse oximetry 96%, weight 235 pounds, BMI 42.9  ?Her skin is warm and dry.  No cervical adenopathy, thyromegaly or carotid bruits.  Chest is clear to auscultation.  TMs are clear.  Breast are without masses.  Cardiac exam: Regular rate and rhythm without murmurs or ectopy.  Abdomen obese soft nondistended without hepatosplenomegaly masses or tenderness.  No lower extremity pitting edema.  Neurological exam is intact without gross focal deficits.  She is status post hysterectomy.  Bimanual exam is normal. ? ? ? ?   ?Assessment & Plan:  ?Essential hypertension-longstanding and stable on current regimen of amlodipine, HCTZ and generic Bystolic 10 mg daily ? ?GE reflux treated with Protonix ? ?BMI 42.98.  Was 44.59 in October 2020.  Was 45.73 in October 2021.  She has made considerable progress with her weight loss. Refill Diethypropion([place]) which has helped her lose weight in the past. ? ?History of depression continue Zoloft 100 mg daily.  This is due to situational stress ? ?Hyperlipidemia treated with Lipitor 40 mg daily ? ?History of hypokalemia treated with Klor-Con 20 mEq daily ? ?Plan: Suggest shingles vaccine.  Consider pneumococcal 20 vaccine.  Tetanus immunization is up-to-date.  She has had 3 COVID-19 vaccines in 2021.  Gets annual flu vaccine.  She is doing well.  May return in 1 year or  as needed.  Mammogram ordered ? ? ? ? ? ?

## 2021-08-11 NOTE — Patient Instructions (Signed)
It was a pleasure to see you today.  I am pleased with your weight loss efforts.  Continue current medications.  Continue diet and exercise regimen.  Return in 1 year or as needed.  Mammogram ordered.  Consider pneumococcal 20 vaccine and Shingrix vaccine. ?

## 2021-09-07 DIAGNOSIS — R2231 Localized swelling, mass and lump, right upper limb: Secondary | ICD-10-CM | POA: Diagnosis not present

## 2021-09-07 DIAGNOSIS — R2232 Localized swelling, mass and lump, left upper limb: Secondary | ICD-10-CM | POA: Diagnosis not present

## 2021-09-07 DIAGNOSIS — M19041 Primary osteoarthritis, right hand: Secondary | ICD-10-CM | POA: Diagnosis not present

## 2021-09-07 DIAGNOSIS — R2233 Localized swelling, mass and lump, upper limb, bilateral: Secondary | ICD-10-CM | POA: Diagnosis not present

## 2021-09-30 ENCOUNTER — Other Ambulatory Visit: Payer: Self-pay | Admitting: Internal Medicine

## 2021-10-05 ENCOUNTER — Other Ambulatory Visit: Payer: Self-pay | Admitting: Internal Medicine

## 2021-10-19 ENCOUNTER — Encounter: Payer: Self-pay | Admitting: Internal Medicine

## 2021-10-19 ENCOUNTER — Ambulatory Visit (INDEPENDENT_AMBULATORY_CARE_PROVIDER_SITE_OTHER): Payer: BC Managed Care – PPO | Admitting: Internal Medicine

## 2021-10-19 VITALS — BP 128/68 | HR 60 | Temp 97.4°F

## 2021-10-19 DIAGNOSIS — L237 Allergic contact dermatitis due to plants, except food: Secondary | ICD-10-CM | POA: Diagnosis not present

## 2021-10-19 DIAGNOSIS — R609 Edema, unspecified: Secondary | ICD-10-CM | POA: Diagnosis not present

## 2021-10-19 DIAGNOSIS — I10 Essential (primary) hypertension: Secondary | ICD-10-CM | POA: Diagnosis not present

## 2021-10-19 MED ORDER — METHYLPREDNISOLONE 4 MG PO TABS: ORAL_TABLET | ORAL | 0 refills | Status: DC

## 2021-10-19 MED ORDER — FUROSEMIDE 20 MG PO TABS: ORAL_TABLET | ORAL | 0 refills | Status: AC

## 2021-10-19 NOTE — Telephone Encounter (Signed)
Called patient to come in for office visit

## 2021-10-19 NOTE — Progress Notes (Signed)
   Subjective:    Patient ID: Andrea Thompson, female    DOB: 1965/09/28, 56 y.o.   MRN: [id number]  HPI 56 year old Female seen for rash on  face,arms and legs after doing yard work recently. Husband has developed poison ivy dermatitis. She was helping him in the yard.  She tried over-the-counter medications such as Cortaid cream, calamine lotion, Benadryl and Zyrtec which have not helped.    Review of Systems She has a longstanding history of hypertension well controlled on current regimen.  She has hyperlipidemia treated with statin medication.  Takes generic Zoloft for anxiety and depression.  History of migraine headaches, irritable bowel syndrome, allergic rhinitis, GE reflux.     Objective:   Physical Exam Blood pressure 128/68 pulse 60 temperature 97.4 degrees pulse oximetry 98%  She has scattered lesions on her face that are erythematous but not weeping.  Has scattered areas on arms and legs as well.  There are no bullous lesions and no weeping lesions. She does have some dependent edema despite taking HCTZ.      Assessment & Plan:  Poison ivy dermatitis  Dependent edema likely related to heat and inflammation from poison ivy dermatitis.  Is taking HCTZ daily.  Plan: Patient will be given Medrol 12-day Dosepak to take in tapering course starting with 6x 4 mg tablets on day 1 and decreasing by 1 tablet every 2 days.  She may take Lasix 20 mg daily for just a few days to help with dependent edema and not take HCTZ while she is taking Lasix.  Keep legs elevated if she is off her feet.  Watch salt intake.  Return as needed.  May apply calamine lotion topically if she so desires.

## 2021-10-19 NOTE — Patient Instructions (Addendum)
Take Lasix sparingly 20 mg daily for dependent edema and do not take HCTZ while taking Lasix.  Once edema has resolved may go back to HCTZ.  Take Medrol in tapering course as directed starting with six 4 mg tablets on day 1 and decrease by 1 tablet every 2 days.  May apply calamine topically if so desired.  Continue other meds as previously prescribed.

## 2021-10-29 ENCOUNTER — Encounter: Payer: Self-pay | Admitting: Internal Medicine

## 2021-10-29 NOTE — Telephone Encounter (Signed)
scheduled

## 2021-10-30 ENCOUNTER — Encounter: Payer: Self-pay | Admitting: Internal Medicine

## 2021-10-30 ENCOUNTER — Ambulatory Visit (INDEPENDENT_AMBULATORY_CARE_PROVIDER_SITE_OTHER): Payer: BC Managed Care – PPO | Admitting: Internal Medicine

## 2021-10-30 VITALS — BP 124/86 | HR 82 | Temp 97.8°F

## 2021-10-30 DIAGNOSIS — L237 Allergic contact dermatitis due to plants, except food: Secondary | ICD-10-CM

## 2021-10-30 DIAGNOSIS — I10 Essential (primary) hypertension: Secondary | ICD-10-CM | POA: Diagnosis not present

## 2021-10-30 MED ORDER — PREDNISONE 10 MG PO TABS: ORAL_TABLET | ORAL | 0 refills | Status: DC

## 2021-10-30 MED ORDER — METHYLPREDNISOLONE ACETATE 80 MG/ML IJ SUSP
80.0000 mg | Freq: Once | INTRAMUSCULAR | Status: AC
  Administered 2021-10-30: 80 mg via INTRAMUSCULAR

## 2021-10-30 MED ORDER — TRIAMCINOLONE ACETONIDE 0.1 % EX CREA: 1.0000 | TOPICAL_CREAM | Freq: Three times a day (TID) | CUTANEOUS | 1 refills | Status: DC

## 2021-10-30 NOTE — Progress Notes (Signed)
   Subjective:    Patient ID: Andrea Thompson, female    DOB: October 23, 1965, 56 y.o.   MRN: [id number]  HPI  56 year old Female seen on July 7 with poison ivy dermatitis.  She called yesterday saying she still had significant skin lesions.  When seen on July 7 she had been working with her husband in their yard.  Both she and her husband came down with poison ivy dermatitis.  She had tried some over-the-counter preparations that did not help.  She has a history of hypertension treated with HCTZ, amlodipine and Bystolic.  She takes atorvastatin for hyperlipidemia.  She takes Zoloft for anxiety and depression as well as Protonix for GE reflux.     Review of Systems see above- no fever or chills  Situational stress with work     Objective:   Physical Exam Temperature 97.8 degrees pulse 82 blood pressure 124/86 pulse oximetry 97% On her legs, she has discrete maculopapular erythematous lesions that are very red but not bullous and not draining.      Assessment & Plan:  Persistent poison ivy dermatitis.  I do not think this is a drug eruption.  Plan: Today she will be treated with prednisone for 12 days and a tapering course starting with 6 x 10 mg tablets and decreasing every 2 days by 1 tablet.  She was previously treated with Medrol 4 mg tablets in tapering course over 12 days starting with 6 tabs on day 1.  At her request, have refill triamcinolone cream.  She was also given Depo-Medrol 80 mg IM once in the office today.  Hypertension-stable on current regimen.  I do not think this is a drug eruption.  Some situational stress at work.  She will let me know if not responding to this treatment.

## 2021-10-30 NOTE — Patient Instructions (Addendum)
Depo-Medrol 80 mg IM given in the office today.  She will take a prednisone taper for 12 days as directed.  She will call if not responding in a few days.  At her request I refilled triamcinolone cream.

## 2021-12-24 DIAGNOSIS — Z6841 Body Mass Index (BMI) 40.0 and over, adult: Secondary | ICD-10-CM | POA: Diagnosis not present

## 2021-12-24 DIAGNOSIS — G4733 Obstructive sleep apnea (adult) (pediatric): Secondary | ICD-10-CM | POA: Diagnosis not present

## 2021-12-27 ENCOUNTER — Other Ambulatory Visit: Payer: Self-pay | Admitting: Internal Medicine

## 2021-12-28 DIAGNOSIS — G4733 Obstructive sleep apnea (adult) (pediatric): Secondary | ICD-10-CM | POA: Diagnosis not present

## 2022-01-02 ENCOUNTER — Other Ambulatory Visit: Payer: Self-pay | Admitting: Internal Medicine

## 2022-03-30 ENCOUNTER — Other Ambulatory Visit: Payer: Self-pay | Admitting: Internal Medicine

## 2022-06-25 ENCOUNTER — Other Ambulatory Visit: Payer: Self-pay | Admitting: Internal Medicine

## 2022-07-11 ENCOUNTER — Other Ambulatory Visit: Payer: BC Managed Care – PPO

## 2022-07-11 DIAGNOSIS — R5383 Other fatigue: Secondary | ICD-10-CM

## 2022-07-11 DIAGNOSIS — I10 Essential (primary) hypertension: Secondary | ICD-10-CM

## 2022-07-11 DIAGNOSIS — E785 Hyperlipidemia, unspecified: Secondary | ICD-10-CM

## 2022-07-16 ENCOUNTER — Encounter: Payer: BC Managed Care – PPO | Admitting: Internal Medicine

## 2022-07-17 ENCOUNTER — Other Ambulatory Visit: Payer: Self-pay | Admitting: Internal Medicine

## 2022-07-20 ENCOUNTER — Other Ambulatory Visit: Payer: Self-pay | Admitting: Internal Medicine

## 2022-07-21 ENCOUNTER — Other Ambulatory Visit: Payer: Self-pay | Admitting: Internal Medicine

## 2022-07-22 ENCOUNTER — Other Ambulatory Visit: Payer: BC Managed Care – PPO

## 2022-07-22 DIAGNOSIS — I10 Essential (primary) hypertension: Secondary | ICD-10-CM | POA: Diagnosis not present

## 2022-07-22 DIAGNOSIS — E786 Lipoprotein deficiency: Secondary | ICD-10-CM | POA: Diagnosis not present

## 2022-07-22 DIAGNOSIS — F419 Anxiety disorder, unspecified: Secondary | ICD-10-CM

## 2022-07-22 DIAGNOSIS — R5383 Other fatigue: Secondary | ICD-10-CM | POA: Diagnosis not present

## 2022-07-23 ENCOUNTER — Ambulatory Visit (INDEPENDENT_AMBULATORY_CARE_PROVIDER_SITE_OTHER): Payer: BC Managed Care – PPO | Admitting: Internal Medicine

## 2022-07-23 ENCOUNTER — Encounter: Payer: Self-pay | Admitting: Internal Medicine

## 2022-07-23 VITALS — BP 128/80 | HR 60 | Temp 97.8°F | Ht 62.0 in | Wt 255.4 lb

## 2022-07-23 DIAGNOSIS — R82998 Other abnormal findings in urine: Secondary | ICD-10-CM

## 2022-07-23 DIAGNOSIS — Z0000 Encounter for general adult medical examination without abnormal findings: Secondary | ICD-10-CM | POA: Diagnosis not present

## 2022-07-23 DIAGNOSIS — B9620 Unspecified Escherichia coli [E. coli] as the cause of diseases classified elsewhere: Secondary | ICD-10-CM

## 2022-07-23 DIAGNOSIS — Z8249 Family history of ischemic heart disease and other diseases of the circulatory system: Secondary | ICD-10-CM

## 2022-07-23 DIAGNOSIS — I10 Essential (primary) hypertension: Secondary | ICD-10-CM

## 2022-07-23 DIAGNOSIS — E786 Lipoprotein deficiency: Secondary | ICD-10-CM

## 2022-07-23 DIAGNOSIS — Z6841 Body Mass Index (BMI) 40.0 and over, adult: Secondary | ICD-10-CM

## 2022-07-23 DIAGNOSIS — F32A Depression, unspecified: Secondary | ICD-10-CM

## 2022-07-23 DIAGNOSIS — N390 Urinary tract infection, site not specified: Secondary | ICD-10-CM | POA: Diagnosis not present

## 2022-07-23 DIAGNOSIS — R609 Edema, unspecified: Secondary | ICD-10-CM

## 2022-07-23 DIAGNOSIS — G4733 Obstructive sleep apnea (adult) (pediatric): Secondary | ICD-10-CM

## 2022-07-23 DIAGNOSIS — J301 Allergic rhinitis due to pollen: Secondary | ICD-10-CM

## 2022-07-23 DIAGNOSIS — K219 Gastro-esophageal reflux disease without esophagitis: Secondary | ICD-10-CM | POA: Diagnosis not present

## 2022-07-23 DIAGNOSIS — F419 Anxiety disorder, unspecified: Secondary | ICD-10-CM

## 2022-07-23 DIAGNOSIS — F4541 Pain disorder exclusively related to psychological factors: Secondary | ICD-10-CM

## 2022-07-23 LAB — COMPLETE METABOLIC PANEL WITH GFR
AG Ratio: 1.7 (calc) (ref 1.0–2.5)
ALT: 26 U/L (ref 6–29)
AST: 12 U/L (ref 10–35)
Albumin: 4.3 g/dL (ref 3.6–5.1)
Alkaline phosphatase (APISO): 95 U/L (ref 37–153)
BUN: 12 mg/dL (ref 7–25)
CO2: 33 mmol/L — ABNORMAL HIGH (ref 20–32)
Calcium: 9.5 mg/dL (ref 8.6–10.4)
Chloride: 104 mmol/L (ref 98–110)
Creat: 0.66 mg/dL (ref 0.50–1.03)
Globulin: 2.6 g/dL (calc) (ref 1.9–3.7)
Glucose, Bld: 96 mg/dL (ref 65–99)
Potassium: 4.4 mmol/L (ref 3.5–5.3)
Sodium: 144 mmol/L (ref 135–146)
Total Bilirubin: 0.8 mg/dL (ref 0.2–1.2)
Total Protein: 6.9 g/dL (ref 6.1–8.1)
eGFR: 103 mL/min/{1.73_m2} (ref >=60)

## 2022-07-23 LAB — CBC WITH DIFFERENTIAL/PLATELET
Absolute Monocytes: 378 cells/uL (ref 200–950)
Basophils Absolute: 38 cells/uL (ref 0–200)
Basophils Relative: 0.6 %
Eosinophils Absolute: 151 cells/uL (ref 15–500)
Eosinophils Relative: 2.4 %
HCT: 37.3 % (ref 35.0–45.0)
Hemoglobin: 12.5 g/dL (ref 11.7–15.5)
Lymphs Abs: 1922 cells/uL (ref 850–3900)
MCH: 28.3 pg (ref 27.0–33.0)
MCHC: 33.5 g/dL (ref 32.0–36.0)
MCV: 84.4 fL (ref 80.0–100.0)
MPV: 9.8 fL (ref 7.5–12.5)
Monocytes Relative: 6 %
Neutro Abs: 3812 cells/uL (ref 1500–7800)
Neutrophils Relative %: 60.5 %
Platelets: 250 10*3/uL (ref 140–400)
RBC: 4.42 10*6/uL (ref 3.80–5.10)
RDW: 13.5 % (ref 11.0–15.0)
Total Lymphocyte: 30.5 %
WBC: 6.3 10*3/uL (ref 3.8–10.8)

## 2022-07-23 LAB — LIPID PANEL
Cholesterol: 125 mg/dL (ref <200)
HDL: 40 mg/dL — ABNORMAL LOW (ref >=50)
LDL Cholesterol (Calc): 67 mg/dL (calc)
Non-HDL Cholesterol (Calc): 85 mg/dL (calc) (ref <130)
Total CHOL/HDL Ratio: 3.1 (calc) (ref <5.0)
Triglycerides: 95 mg/dL (ref <150)

## 2022-07-23 LAB — TSH: TSH: 2.15 mIU/L (ref 0.40–4.50)

## 2022-07-23 LAB — POCT URINALYSIS DIPSTICK
Bilirubin, UA: NEGATIVE
Blood, UA: NEGATIVE
Glucose, UA: NEGATIVE
Ketones, UA: NEGATIVE
Nitrite, UA: NEGATIVE
Protein, UA: NEGATIVE
Spec Grav, UA: 1.01 (ref 1.010–1.025)
Urobilinogen, UA: 0.2 E.U./dL
pH, UA: 5 (ref 5.0–8.0)

## 2022-07-23 NOTE — Patient Instructions (Addendum)
Referral to weight loss clinic. Information provided. Labs are stable. Please see GYN physician. Have annual mammogram. Labs are stable Vaccines discussed. Colonoscopy is up to date. You have a E.coli UTI that requires a follow up nurse visit to check urine.

## 2022-07-23 NOTE — Progress Notes (Signed)
Patient Care Team: Margaree Mackintosh, MD as PCP - General (Internal Medicine)  Visit Date: 07/23/22  Subjective:    Patient ID: Andrea Thompson , Female   DOB: 29-Dec-1965, 57 y.o.    MRN: [id number]   57 y.o. Female presents today for annual comprehensive physical exam. Patient has a past medical history of anxiety, GERD, migraine headaches, Vitamin D deficiency, hyperlipidemia.  History of hyperlipidemia treated with atorvastatin 40 mg daily at 6 pm. HDL low at 40 on 07/22/22, lipid panel otherwise normal.  Joint pain is currently stable.   History of GERD treated with pantoprazole 40 mg daily.  History of anxiety and depression treated with sertraline 100 mg daily.  History of edema treated with furosemide 20 mg daily.   BMI at 46.71. Discussed going to a weight loss clinic, medications.  Had appendectomy at [facility] in February 2020.   Had at home sleep study and now has CPAP device from [facility] for obstructive sleep apnea.   History of ventricular bigeminy and palpitations which are controlled with diastolic. Hypertension is longstanding, treated with nebivolol 10 mg daily, hydrochlorothiazide 25 mg daily, amlodipine 5 mg daily. Blood pressure normal today at 128/80. Ventricular bigeminy was diagnosed in February 2012 during an evaluation in the emergency department for chest pain and palpitations. She had a stress echocardiogram that was normal. She was reassured then that she was at low risk for coronary disease.  Glucose normal. Thyroid, liver, kidney function normal.  Status post abdominal hysterectomy.  Mammogram last completed 08/01/20. No mammographic evidence of malignancy. Recommended repeat in 2023.  Last colonoscopy in June 2020 by Dr. Loman Chroman in [facility]. Results showed one polyp in transverse colon. Pathology showed lymphoid aggregate. Recommended repeat in 2030.  Past Medical History:  Diagnosis Date   Anxiety    GERD (gastroesophageal  reflux disease)    Migraine headache    Vitamin D deficiency      Family History  Problem Relation Age of Onset   Heart disease Mother        started at age 94, recently had her 5th MI as of 2018   Cancer Father     Social History   Social History Narrative       Social history: She is married and has a GED degree.  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  Husband works for [employer].  She previously smoked a pack of cigarettes daily for some 19 years but stopped in 2008.  She has 1 son and 2 daughters.       Family history: Mother with history of coronary artery disease and angioplasty at age 47.  Had CABG at age 75.  Mother subsequently had MI at age 57.  Mother is debilitated and apparently has had stroke now living in nursing facility.  Formerly lived with the patient.  1 brother in good health.  Father died of lung cancer 11/28/02.  Grandfather with history of CABG.      Review of Systems  Constitutional:  Negative for chills, fever, malaise/fatigue and weight loss.  HENT:  Negative for hearing loss, sinus pain and sore throat.   Respiratory:  Negative for cough, hemoptysis and shortness of breath.   Cardiovascular:  Negative for chest pain, palpitations, leg swelling and PND.  Gastrointestinal:  Negative for abdominal pain, constipation, diarrhea, heartburn, nausea and vomiting.  Genitourinary:  Negative for dysuria, frequency and urgency.  Musculoskeletal:  Negative  for back pain, myalgias and neck pain.  Skin:  Negative for itching and rash.  Neurological:  Negative for dizziness, tingling, seizures and headaches.  Endo/Heme/Allergies:  Negative for polydipsia.  Psychiatric/Behavioral:  Negative for depression. The patient is not nervous/anxious.         Objective:   Vitals: BP 128/80   Pulse 60   Temp 97.8 F (36.6 C) (Tympanic)   Ht 5\' 2"  (1.575 m)   Wt 255 lb 6.4 oz (115.8 kg)   SpO2 98%   BMI 46.71 kg/m    Physical  Exam Vitals and nursing note reviewed.  Constitutional:      General: She is not in acute distress.    Appearance: Normal appearance. She is not ill-appearing or toxic-appearing.  HENT:     Head: Normocephalic and atraumatic.     Right Ear: Hearing, tympanic membrane, ear canal and external ear normal.     Left Ear: Hearing, tympanic membrane, ear canal and external ear normal.     Mouth/Throat:     Pharynx: Oropharynx is clear.  Eyes:     Extraocular Movements: Extraocular movements intact.     Pupils: Pupils are equal, round, and reactive to light.  Neck:     Thyroid: No thyroid mass, thyromegaly or thyroid tenderness.     Vascular: No carotid bruit.  Cardiovascular:     Rate and Rhythm: Normal rate and regular rhythm. No extrasystoles are present.    Pulses:          Dorsalis pedis pulses are 1+ on the right side and 1+ on the left side.     Heart sounds: Normal heart sounds. No murmur heard.    No friction rub. No gallop.     Comments: Trace pitting edema. Pulmonary:     Effort: Pulmonary effort is normal.     Breath sounds: Normal breath sounds. No decreased breath sounds, wheezing, rhonchi or rales.  Chest:     Chest wall: No mass.  Abdominal:     Palpations: Abdomen is soft. There is no hepatomegaly, splenomegaly or mass.     Tenderness: There is no abdominal tenderness.     Hernia: No hernia is present.  Musculoskeletal:     Cervical back: Normal range of motion.     Right lower leg: 1+ Pitting Edema present.     Left lower leg: 1+ Pitting Edema present.  Lymphadenopathy:     Cervical: No cervical adenopathy.     Upper Body:     Right upper body: No supraclavicular adenopathy.     Left upper body: No supraclavicular adenopathy.  Skin:    General: Skin is warm and dry.  Neurological:     General: No focal deficit present.     Mental Status: She is alert and oriented to person, place, and time. Mental status is at baseline.     Sensory: Sensation is intact.      Motor: Motor function is intact. No weakness.     Deep Tendon Reflexes: Reflexes are normal and symmetric.  Psychiatric:        Attention and Perception: Attention normal.        Mood and Affect: Mood normal.        Speech: Speech normal.        Behavior: Behavior normal.        Thought Content: Thought content normal.        Cognition and Memory: Cognition normal.        Judgment: Judgment  normal.       Results:   Studies obtained and personally reviewed by me:  Mammogram last completed 08/01/20. No mammographic evidence of malignancy. Recommended repeat in 2023.  Last colonoscopy in June 2020 by Dr. [facility]. Results showed one polyp in transverse colon. Pathology showed lymphoid aggregate. Recommended repeat in 2030.   Labs:       Component Value Date/Time   NA 144 07/22/2022 1111   K 4.4 07/22/2022 1111   CL 104 07/22/2022 1111   CO2 33 (H) 07/22/2022 1111   GLUCOSE 96 07/22/2022 1111   BUN 12 07/22/2022 1111   CREATININE 0.66 07/22/2022 1111   CALCIUM 9.5 07/22/2022 1111   PROT 6.9 07/22/2022 1111   ALBUMIN 3.6 01/17/2017 1105   AST 12 07/22/2022 1111   ALT 26 07/22/2022 1111   ALKPHOS 74 01/17/2017 1105   BILITOT 0.8 07/22/2022 1111   GFRNONAA 99 01/25/2020 0931   GFRAA 115 01/25/2020 0931     Lab Results  Component Value Date   WBC 6.3 07/22/2022   HGB 12.5 07/22/2022   HCT 37.3 07/22/2022   MCV 84.4 07/22/2022   PLT 250 07/22/2022    Lab Results  Component Value Date   CHOL 125 07/22/2022   HDL 40 (L) 07/22/2022   LDLCALC 67 07/22/2022   TRIG 95 07/22/2022   CHOLHDL 3.1 07/22/2022    Lab Results  Component Value Date   HGBA1C 5.1 01/17/2017     Lab Results  Component Value Date   TSH 2.15 07/22/2022      Assessment & Plan:   Hyperlipidemia: treated with atorvastatin 40 mg daily at 6 pm. HDL low at 40 on 07/22/22, lipid panel otherwise normal.  GERD: treated with pantoprazole 40 mg daily.  Anxiety and depression: treated  with sertraline 100 mg daily.  Dependent edema: treated with furosemide 20 mg daily.   BMI at 46.71: discussed going to a weight loss clinic, medications.  Obstructive sleep apnea: stable with CPAP.  Ventricular bigeminy, palpitations, hypertension: treated with nebivolol 10 mg daily, hydrochlorothiazide 25 mg daily, amlodipine 5 mg daily. Blood pressure normal today at 128/80.  Status post abdominal hysterectomy. She will contact her gynecologist to schedule a visit.  Mammogram: last completed 08/01/20. No mammographic evidence of malignancy. Recommended repeat in 2023.  Colonoscopy: last completed in June 2020 [facility]. Results showed one polyp in transverse colon. Pathology showed lymphoid aggregate. Recommended repeat in 2030.  Vaccine counseling: UTD on tetanus vaccine. Call back with Covid-19 vaccine dates. Declines pneumococcal 20 vaccine.  Return in 1 year for health maintenance exam or as needed.  E.coli UTI: culture grew E.coli and pt was treated with Macrobid 100 mg twice daily for 7 days and nurse visit     I,Alexander Ruley,acting as a scribe for Margaree MackintoshMary J Amena Dockham, MD.,have documented all relevant documentation on the behalf of Margaree MackintoshMary J Becci Batty, MD,as directed by  Margaree MackintoshMary J Tamiyah Moulin, MD while in the presence of Margaree MackintoshMary J Glendora Clouatre, MD.   I, Margaree MackintoshMary J Jemarion Roycroft, MD, have reviewed all documentation for this visit. The documentation on 08/07/22 for the exam, diagnosis, procedures, and orders are all accurate and complete.

## 2022-07-25 ENCOUNTER — Other Ambulatory Visit: Payer: Self-pay

## 2022-07-25 LAB — URINE CULTURE
MICRO NUMBER:: 14800763
SPECIMEN QUALITY:: ADEQUATE

## 2022-07-25 MED ORDER — NITROFURANTOIN MONOHYD MACRO 100 MG PO CAPS: 100.0000 mg | ORAL_CAPSULE | Freq: Two times a day (BID) | ORAL | 0 refills | Status: DC

## 2022-08-05 ENCOUNTER — Ambulatory Visit (INDEPENDENT_AMBULATORY_CARE_PROVIDER_SITE_OTHER): Payer: BC Managed Care – PPO

## 2022-08-05 VITALS — BP 134/76 | HR 75 | Temp 97.9°F | Ht 62.0 in | Wt 255.0 lb

## 2022-08-05 DIAGNOSIS — N390 Urinary tract infection, site not specified: Secondary | ICD-10-CM

## 2022-08-05 DIAGNOSIS — B9620 Unspecified Escherichia coli [E. coli] as the cause of diseases classified elsewhere: Secondary | ICD-10-CM

## 2022-08-05 DIAGNOSIS — Z0189 Encounter for other specified special examinations: Secondary | ICD-10-CM

## 2022-08-05 LAB — POCT URINALYSIS DIPSTICK
Bilirubin, UA: NEGATIVE
Blood, UA: NEGATIVE
Glucose, UA: NEGATIVE
Ketones, UA: NEGATIVE
Leukocytes, UA: NEGATIVE
Nitrite, UA: NEGATIVE
Protein, UA: NEGATIVE
Spec Grav, UA: 1.015 (ref 1.010–1.025)
Urobilinogen, UA: 0.2 E.U./dL
pH, UA: 5 (ref 5.0–8.0)

## 2022-08-05 NOTE — Progress Notes (Addendum)
Patient here for UTI follow -up today. She was here for Health Maintenance on 07/23/22 had abnormal urine specimen at that time and culture grew E.coli treated with Macrobid. Vital signs are within normal limits and patient is feeling much better.  IMargaree Mackintosh, MD, have reviewed all documentation for this visit. The documentation on 08/05/22 for the exam, diagnosis, procedures, and orders are all accurate and complete.

## 2022-08-07 DIAGNOSIS — N390 Urinary tract infection, site not specified: Secondary | ICD-10-CM | POA: Insufficient documentation

## 2022-08-13 ENCOUNTER — Other Ambulatory Visit: Payer: Self-pay | Admitting: Internal Medicine

## 2022-08-26 ENCOUNTER — Other Ambulatory Visit: Payer: Self-pay | Admitting: Internal Medicine

## 2022-09-06 ENCOUNTER — Emergency Department (HOSPITAL_COMMUNITY): Payer: BC Managed Care – PPO

## 2022-09-06 ENCOUNTER — Encounter (HOSPITAL_COMMUNITY): Payer: Self-pay

## 2022-09-06 ENCOUNTER — Emergency Department (HOSPITAL_COMMUNITY)
Admission: EM | Admit: 2022-09-06 | Discharge: 2022-09-06 | Disposition: A | Discharge status: Home / Self Care | Payer: BC Managed Care – PPO | Attending: Emergency Medicine | Admitting: Emergency Medicine

## 2022-09-06 ENCOUNTER — Other Ambulatory Visit: Payer: Self-pay

## 2022-09-06 DIAGNOSIS — R55 Syncope and collapse: Secondary | ICD-10-CM | POA: Insufficient documentation

## 2022-09-06 DIAGNOSIS — Z7982 Long term (current) use of aspirin: Secondary | ICD-10-CM | POA: Insufficient documentation

## 2022-09-06 DIAGNOSIS — R739 Hyperglycemia, unspecified: Secondary | ICD-10-CM | POA: Diagnosis not present

## 2022-09-06 LAB — URINALYSIS, ROUTINE W REFLEX MICROSCOPIC
Bacteria, UA: NONE SEEN
Bilirubin Urine: NEGATIVE
Glucose, UA: NEGATIVE mg/dL
Hgb urine dipstick: NEGATIVE
Ketones, ur: NEGATIVE mg/dL
Nitrite: NEGATIVE
Protein, ur: NEGATIVE mg/dL
Specific Gravity, Urine: 1.008 (ref 1.005–1.030)
pH: 5 (ref 5.0–8.0)

## 2022-09-06 LAB — CBC
HCT: 36.9 % (ref 36.0–46.0)
Hemoglobin: 12.6 g/dL (ref 12.0–15.0)
MCH: 28.6 pg (ref 26.0–34.0)
MCHC: 34.1 g/dL (ref 30.0–36.0)
MCV: 83.7 fL (ref 80.0–100.0)
Platelets: 236 10*3/uL (ref 150–400)
RBC: 4.41 MIL/uL (ref 3.87–5.11)
RDW: 13 % (ref 11.5–15.5)
WBC: 7 10*3/uL (ref 4.0–10.5)
nRBC: 0 % (ref 0.0–0.2)

## 2022-09-06 LAB — BASIC METABOLIC PANEL
Anion gap: 10 (ref 5–15)
BUN: 13 mg/dL (ref 6–20)
CO2: 25 mmol/L (ref 22–32)
Calcium: 9 mg/dL (ref 8.9–10.3)
Chloride: 103 mmol/L (ref 98–111)
Creatinine, Ser: 0.73 mg/dL (ref 0.44–1.00)
GFR, Estimated: >60 mL/min (ref >60)
Glucose, Bld: 140 mg/dL — ABNORMAL HIGH (ref 70–99)
Potassium: 3.5 mmol/L (ref 3.5–5.1)
Sodium: 138 mmol/L (ref 135–145)

## 2022-09-06 LAB — D-DIMER, QUANTITATIVE: D-Dimer, Quant: 0.28 ug/mL-FEU (ref 0.00–0.50)

## 2022-09-06 LAB — CBG MONITORING, ED: Glucose-Capillary: 135 mg/dL — ABNORMAL HIGH (ref 70–99)

## 2022-09-06 NOTE — ED Provider Notes (Signed)
[place] EMERGENCY DEPARTMENT AT [facility] Provider Note   CSN: [id number] Arrival date & time: 09/06/22  1141     History Chief Complaint  Patient presents with   Near Syncope    Andrea Thompson is a 57 y.o. female.  Patient presents emergency department complaints of near syncope.  She reports that she was at work earlier today when she began to feel weak and lightheaded like she could pass out but was able to avoid passing out by sitting down and letting the sensation passed.  Denies any history of any cardiac abnormalities or arrhythmias.  Currently takes aspirin for heart health but no prior history of any chronic clots or emboli.  Patient currently denies any chest pain, shortness of breath.  Reports that she acutely feels that she has improved and only has a mild headache that is lingering.   Near Syncope       Home Medications Prior to Admission medications   Medication Sig Start Date End Date Taking? Authorizing Provider  amLODipine (NORVASC) 5 MG tablet TAKE 1 TABLET BY MOUTH EVERY DAY 03/30/22   Margaree Mackintosh, MD  aspirin 81 MG EC tablet TAKE 1 TABLET (81 MG TOTAL) BY MOUTH DAILY. PLEASE SCHEDULE APPOINTMENT WITH DR. HILTY 12/17/18   Hilty, Lisette Abu, MD  atorvastatin (LIPITOR) 40 MG tablet TAKE 1 TABLET BY MOUTH DAILY AT 6 PM. PLEASE CALL AND SCHEDULE AN APPT FOR FURTHER REFILLS 2ND APPT 01/02/22   Margaree Mackintosh, MD  Diethylpropion HCl CR 75 MG TB24 Take 1 tablet (75 mg total) by mouth daily. Patient taking differently: Take 75 mg by mouth as needed. 07/10/21   Margaree Mackintosh, MD  furosemide (LASIX) 20 MG tablet One tab daily for dependent edema. Do not take HCTZ while taking this medication. 10/19/21   Margaree Mackintosh, MD  hydrochlorothiazide (HYDRODIURIL) 25 MG tablet TAKE 1 TABLET BY MOUTH EVERY DAY 08/26/22   Margaree Mackintosh, MD  nebivolol (BYSTOLIC) 10 MG tablet TAKE 1 TABLET BY MOUTH EVERY DAY 08/26/22   Margaree Mackintosh, MD  nitrofurantoin,  macrocrystal-monohydrate, (MACROBID) 100 MG capsule Take 1 capsule (100 mg total) by mouth 2 (two) times daily. 07/25/22   Margaree Mackintosh, MD  pantoprazole (PROTONIX) 40 MG tablet TAKE 1 TABLET BY MOUTH EVERY DAY 08/26/22   Margaree Mackintosh, MD  potassium chloride SA (KLOR-CON M20) 20 MEQ tablet TAKE 2 TABLETS BY MOUTH EVERY DAY 08/26/22   Margaree Mackintosh, MD  predniSONE (DELTASONE) 10 MG tablet Take in tapering course 6-6-5-5-4-4-3-3-2-2-1-1 as directed for persistent contact dermatitis Patient not taking: Reported on 07/23/2022 10/30/21   Margaree Mackintosh, MD  sertraline (ZOLOFT) 100 MG tablet TAKE 1 TABLET BY MOUTH EVERY DAY 08/13/22   Margaree Mackintosh, MD  triamcinolone cream (KENALOG) 0.1 % Apply 1 Application topically 3 (three) times daily. Patient not taking: Reported on 07/23/2022 10/30/21   Margaree Mackintosh, MD      Allergies    Penicillins, Imitrex [sumatriptan base], and Cinnamon    Review of Systems   Review of Systems  Cardiovascular:  Positive for near-syncope.  Neurological:  Positive for weakness.  All other systems reviewed and are negative.   Physical Exam Updated Vital Signs BP (!) 143/61   Pulse 65   Temp 98.4 F (36.9 C) (Oral)   Resp 16   Ht 5\' 1"  (1.549 m)   Wt 115.7 kg   SpO2 97%   BMI 48.18 kg/m  Physical  Exam Vitals and nursing note reviewed.  Constitutional:      General: She is not in acute distress.    Appearance: She is well-developed.  HENT:     Head: Normocephalic and atraumatic.  Eyes:     Conjunctiva/sclera: Conjunctivae normal.  Cardiovascular:     Rate and Rhythm: Normal rate and regular rhythm.     Heart sounds: No murmur heard. Pulmonary:     Effort: Pulmonary effort is normal. No respiratory distress.     Breath sounds: Normal breath sounds.  Abdominal:     Palpations: Abdomen is soft.     Tenderness: There is no abdominal tenderness.  Musculoskeletal:        General: No swelling.     Cervical back: Neck supple.  Skin:    General: Skin is  warm and dry.     Capillary Refill: Capillary refill takes less than 2 seconds.  Neurological:     Mental Status: She is alert.  Psychiatric:        Mood and Affect: Mood normal.     ED Results / Procedures / Treatments   Labs (all labs ordered are listed, but only abnormal results are displayed) Labs Reviewed  BASIC METABOLIC PANEL - Abnormal; Notable for the following components:      Result Value   Glucose, Bld 140 (*)    All other components within normal limits  URINALYSIS, ROUTINE W REFLEX MICROSCOPIC - Abnormal; Notable for the following components:   Color, Urine STRAW (*)    Leukocytes,Ua TRACE (*)    All other components within normal limits  CBG MONITORING, ED - Abnormal; Notable for the following components:   Glucose-Capillary 135 (*)    All other components within normal limits  CBC  D-DIMER, QUANTITATIVE    EKG None  Radiology DG Chest 2 View  Result Date: 09/06/2022 CLINICAL DATA:  Near syncope EXAM: CHEST - 2 VIEW COMPARISON:  04/05/2007 FINDINGS: Cardiomediastinal silhouette and pulmonary vasculature are within normal limits. Lungs are clear. IMPRESSION: No acute cardiopulmonary process. Electronically Signed   By: Acquanetta Belling M.D.   On: 09/06/2022 13:52   CT Head Wo Contrast  Result Date: 09/06/2022 CLINICAL DATA:  Syncope/presyncope, cerebrovascular cause suspected EXAM: CT HEAD WITHOUT CONTRAST TECHNIQUE: Contiguous axial images were obtained from the base of the skull through the vertex without intravenous contrast. RADIATION DOSE REDUCTION: This exam was performed according to the departmental dose-optimization program which includes automated exposure control, adjustment of the mA and/or kV according to patient size and/or use of iterative reconstruction technique. COMPARISON:  CT Head 05/16/04 FINDINGS: Brain: No evidence of acute infarction, hemorrhage, hydrocephalus, extra-axial collection or mass lesion/mass effect. Vascular: No hyperdense vessel or  unexpected calcification. Skull: Normal. Negative for fracture or focal lesion. Sinuses/Orbits: No middle ear or mastoid effusion. Paranasal sinuses are clear. Orbits are unremarkable. Other: None. IMPRESSION: No acute intracranial abnormality. Electronically Signed   By: Lorenza Cambridge M.D.   On: 09/06/2022 13:43    Procedures Procedures   Medications Ordered in ED Medications - No data to display  ED Course/ Medical Decision Making/ A&P                           Medical Decision Making Amount and/or Complexity of Data Reviewed Labs: ordered. Radiology: ordered.   This patient presents to the ED for concern of near syncope.  Differential diagnosis includes vasovagal syncope, pulmonary embolism, MI, dehydration, hypoglycemia   Lab Tests:  I Ordered, and personally interpreted labs.  The pertinent results include: CBC, BMP unremarkable with exception of mild hyperglycemia, UA without signs of infection, D-dimer negative   Imaging Studies ordered:  I ordered imaging studies including chest x-ray, CT head I independently visualized and interpreted imaging which showed no acute cardiopulmonary process, no evidence of any intracranial abnormality I agree with the radiologist interpretation   Problem List / ED Course:  Patient presents emergency department complaints of a near syncopal episode.  She reports that she was at work earlier today when she began to feel faint and required to sit down as she felt she would fall over.  Patient denied any chest pain or any trouble breathing prior to the onset of the near syncopal episode.  Given unclear etiology of potential symptoms, broad workup initiated for further evaluation of patient's symptoms. CBC unremarkable as well as BMP with only mild hyperglycemia noted.  UA without signs of infection.  D-dimer negative.  Chest x-ray without signs of any cardiopulmonary process and CT head without signs of any intracranial normalities.  Have low  concern for potential life-threatening causes of syncope such as pneumothorax, PE, MI, stroke.  Patient did report that she has an allergy to cinnamon and was eating donuts this morning that she does not believe contains cinnamon.  Unclear if this would cause the severity of patient's symptoms but is a potential cause of her near syncopal episode. Largely reassuring findings on workup and patient remaining hemodynamically stable during entire course of stay here in the emergency department, I believe the patient is safe for discharge home.  Encourage patient to follow-up with primary care provider.  Patient is agreeable to treatment plan verbalized understanding all return precautions.  All questions answered prior to patient discharge.`  Final Clinical Impression(s) / ED Diagnoses Final diagnoses:  Near syncope    Rx / DC Orders ED Discharge Orders     None         Salomon Mast 09/06/22 1509    Linwood Dibbles, MD 09/07/22 1730

## 2022-09-06 NOTE — ED Triage Notes (Signed)
Pt works in a [employer]. Pt states her BP 188/110. Pt had cotton mouth, as well as dizzy. Pt c/o sudden weakness. Pt had a near syncopal event at work.  Pt states she has a headache now and c/o heat in her arms. Pt also c/o feeling woozy.

## 2022-09-06 NOTE — Discharge Instructions (Signed)
You were seen in the emergency department for a near syncopal episode.  Thankfully your labs and imaging were all reassuring at this time without any acute cause noted to your symptoms.  However, the more concerning underlying causes such as a pulm embolism, stroke, myocardial infarction have been largely ruled out with negative findings on your lab work.  I would encourage you to follow-up with your primary care provider to ensure further evaluation as needed.  If you feel your symptoms return or worsen please return to the emergency department for further evaluation.

## 2022-09-10 DIAGNOSIS — Z1272 Encounter for screening for malignant neoplasm of vagina: Secondary | ICD-10-CM | POA: Diagnosis not present

## 2022-09-10 DIAGNOSIS — Z01419 Encounter for gynecological examination (general) (routine) without abnormal findings: Secondary | ICD-10-CM | POA: Diagnosis not present

## 2022-09-10 DIAGNOSIS — Z6841 Body Mass Index (BMI) 40.0 and over, adult: Secondary | ICD-10-CM | POA: Diagnosis not present

## 2022-09-10 DIAGNOSIS — Z1231 Encounter for screening mammogram for malignant neoplasm of breast: Secondary | ICD-10-CM | POA: Diagnosis not present

## 2022-09-10 LAB — HM PAP SMEAR: HM Pap smear: NEGATIVE

## 2022-09-11 ENCOUNTER — Ambulatory Visit (INDEPENDENT_AMBULATORY_CARE_PROVIDER_SITE_OTHER): Payer: BC Managed Care – PPO | Admitting: Family Medicine

## 2022-09-11 ENCOUNTER — Encounter (INDEPENDENT_AMBULATORY_CARE_PROVIDER_SITE_OTHER): Payer: Self-pay | Admitting: Family Medicine

## 2022-09-11 VITALS — BP 138/72 | HR 63 | Temp 98.7°F | Ht 61.0 in | Wt 254.0 lb

## 2022-09-11 DIAGNOSIS — F419 Anxiety disorder, unspecified: Secondary | ICD-10-CM

## 2022-09-11 DIAGNOSIS — I10 Essential (primary) hypertension: Secondary | ICD-10-CM

## 2022-09-11 DIAGNOSIS — F32A Depression, unspecified: Secondary | ICD-10-CM | POA: Diagnosis not present

## 2022-09-11 DIAGNOSIS — G4733 Obstructive sleep apnea (adult) (pediatric): Secondary | ICD-10-CM | POA: Diagnosis not present

## 2022-09-11 DIAGNOSIS — Z0289 Encounter for other administrative examinations: Secondary | ICD-10-CM

## 2022-09-11 DIAGNOSIS — Z6841 Body Mass Index (BMI) 40.0 and over, adult: Secondary | ICD-10-CM

## 2022-09-11 NOTE — Assessment & Plan Note (Signed)
Managed by [facility].   Wearing auto CPAP at night Restless sleep at night due to husband's Dexcom going off at night.  He is a brittle type I diabetic.  Look for improvements in OSA with weight reduction Aim for 7-8 hrs of sleep at night with CPAP

## 2022-09-11 NOTE — Progress Notes (Signed)
Office: [phone]  /  Fax: [phone]   Initial Visit  ALON BELLEW was seen in clinic today to evaluate for obesity. She is interested in losing weight to improve overall health and reduce the risk of weight related complications. She presents today to review program treatment options, initial physical assessment, and evaluation.     She was referred by: PCP  When asked what else they would like to accomplish? She states: Adopt healthier eating patterns, Improve existing medical conditions, Improve quality of life, and Improve appearance  Weight history:  gained weight since 2019; would like to get down to 170 lb where she was in her 30s.  Works a sedentary job.  Works Location manager at [employer]  When asked how has your weight affected you? She states: Contributed to medical problems, Contributed to orthopedic problems or mobility issues, and Having fatigue  Some associated conditions: Hypertension, Hyperlipidemia, and OSA  Contributing factors: Family history, Nutritional, and Stress  Weight promoting medications identified: None  Current nutrition plan: None  Current level of physical activity: NEAT  Current or previous pharmacotherapy: Other: diethylproprion ER 75 mg - helped some with stress eating.  Has been on and off of it for a few years.    Response to medication: Ineffective so it was discontinued   Past medical history includes:   Past Medical History:  Diagnosis Date   Anxiety    GERD (gastroesophageal reflux disease)    Migraine headache    Vitamin D deficiency      Objective:   BP 138/72   Pulse 63   Temp 98.7 F (37.1 C)   Ht 5\' 1"  (1.549 m)   Wt 254 lb (115.2 kg)   SpO2 97%   BMI 47.99 kg/m  She was weighed on the bioimpedance scale: Body mass index is 47.99 kg/m.  Peak NUUVOZ:366 , Body Fat%:53.9, Visceral Fat Rating:20, Weight trend over the last 12 months: Increasing  General:  Alert, oriented and cooperative. Patient is in no  acute distress.  Respiratory: Normal respiratory effort, no problems with respiration noted   Gait: able to ambulate independently  Mental Status: Normal mood and affect. Normal behavior. Normal judgment and thought content.   DIAGNOSTIC DATA REVIEWED:  BMET    Component Value Date/Time   NA 138 09/06/2022 1204   K 3.5 09/06/2022 1204   CL 103 09/06/2022 1204   CO2 25 09/06/2022 1204   GLUCOSE 140 (H) 09/06/2022 1204   BUN 13 09/06/2022 1204   CREATININE 0.73 09/06/2022 1204   CREATININE 0.66 07/22/2022 1111   CALCIUM 9.0 09/06/2022 1204   GFRNONAA >60 09/06/2022 1204   GFRNONAA 99 01/25/2020 0931   GFRAA 115 01/25/2020 0931   Lab Results  Component Value Date   HGBA1C 5.1 01/17/2017   No results found for: "INSULIN" CBC    Component Value Date/Time   WBC 7.0 09/06/2022 1204   RBC 4.41 09/06/2022 1204   HGB 12.6 09/06/2022 1204   HCT 36.9 09/06/2022 1204   PLT 236 09/06/2022 1204   MCV 83.7 09/06/2022 1204   MCH 28.6 09/06/2022 1204   MCHC 34.1 09/06/2022 1204   RDW 13.0 09/06/2022 1204   Iron/TIBC/Ferritin/ %Sat No results found for: "IRON", "TIBC", "FERRITIN", "IRONPCTSAT" Lipid Panel     Component Value Date/Time   CHOL 125 07/22/2022 1111   TRIG 95 07/22/2022 1111   HDL 40 (L) 07/22/2022 1111   CHOLHDL 3.1 07/22/2022 1111   VLDL 25 01/17/2017 0522   LDLCALC  67 07/22/2022 1111   Hepatic Function Panel     Component Value Date/Time   PROT 6.9 07/22/2022 1111   ALBUMIN 3.6 01/17/2017 1105   AST 12 07/22/2022 1111   ALT 26 07/22/2022 1111   ALKPHOS 74 01/17/2017 1105   BILITOT 0.8 07/22/2022 1111   BILIDIR 0.1 07/22/2019 0903   IBILI 0.4 07/22/2019 0903      Component Value Date/Time   TSH 2.15 07/22/2022 1111     Assessment and Plan:   Essential hypertension Assessment & Plan: Currently on Amlodopine 5 mg QD, HCTZ 25 mg QD + Bystolic 10 mg QD. Denies CP, DOE or HA. + fam hx of premature heart disease (mom) Echo reviewed from October 2018  showing normal LVEF  Look for improvements in BP control with healthy lifestyle changes and weight reduction Continue current BP medications     Obesity, Class III, BMI 40-49.9 (morbid obesity) (HCC) Assessment & Plan: Reviewed program information, current bioimpedence results and expectations. She has been on and off Diethylproprion ER 75 mg for 1-2 years with her PCP but has seen minimal weight loss. She reports a poor diet and no regular exercise She is not considering weight loss surgery.  She will return for fasting labs, IC test.  EKG reviewed from yesterday.    Anxiety and depression Assessment & Plan: Reports stable mood on Zoloft 100 mg daily Stress levels are high but she has a good support system  Continue Zoloft 100 mg daily and work on stress reduction techniques.   Obstructive sleep apnea syndrome Assessment & Plan: Managed by [facility].   Wearing auto CPAP at night Restless sleep at night due to husband's Dexcom going off at night.  He is a brittle type I diabetic.  Look for improvements in OSA with weight reduction Aim for 7-8 hrs of sleep at night with CPAP         Obesity Treatment / Action Plan:  Patient will work on garnering support from family and friends to begin weight loss journey. Will work on eliminating or reducing the presence of highly palatable, calorie dense foods in the home. Will complete provided nutritional and psychosocial assessment questionnaire before the next appointment. Will be scheduled for indirect calorimetry to determine resting energy expenditure in a fasting state.  This will allow [place] to create a reduced calorie, high-protein meal plan to promote loss of fat mass while preserving muscle mass. Will work on managing stress via relaxation methods as this may result in unhealthy eating patterns. Counseled on the health benefits of losing 5%-15% of total body weight. Was counseled on nutritional approaches to  weight loss and benefits of reducing processed foods and consuming plant-based foods and high quality protein as part of nutritional weight management. Was counseled on pharmacotherapy and role as an adjunct in weight management.   Obesity Education Performed Today:  She was weighed on the bioimpedance scale and results were discussed and documented in the synopsis.  We discussed obesity as a disease and the importance of a more detailed evaluation of all the factors contributing to the disease.  We discussed the importance of long term lifestyle changes which include nutrition, exercise and behavioral modifications as well as the importance of customizing this to her specific health and social needs.  We discussed the benefits of reaching a healthier weight to alleviate the symptoms of existing conditions and reduce the risks of the biomechanical, metabolic and psychological effects of obesity.  [name] appears  to be in the action stage of change and states they are ready to start intensive lifestyle modifications and behavioral modifications.  30 minutes was spent today on this visit including the above counseling, pre-visit chart review, and post-visit documentation.  Reviewed by clinician on day of visit: allergies, medications, problem list, medical history, surgical history, family history, social history, and previous encounter notes pertinent to obesity diagnosis.    Seymour Bars, D.O. DABFM, DABOM [facility] [phone] [place], [place] [address] [phone]

## 2022-09-11 NOTE — Assessment & Plan Note (Signed)
Reviewed program information, current bioimpedence results and expectations. She has been on and off Diethylproprion ER 75 mg for 1-2 years with her PCP but has seen minimal weight loss. She reports a poor diet and no regular exercise She is not considering weight loss surgery.  She will return for fasting labs, IC test.  EKG reviewed from yesterday.

## 2022-09-11 NOTE — Assessment & Plan Note (Signed)
Currently on Amlodopine 5 mg QD, HCTZ 25 mg QD + Bystolic 10 mg QD. Denies CP, DOE or HA. + fam hx of premature heart disease (mom) Echo reviewed from October 2018 showing normal LVEF  Look for improvements in BP control with healthy lifestyle changes and weight reduction Continue current BP medications

## 2022-09-11 NOTE — Assessment & Plan Note (Signed)
Reports stable mood on Zoloft 100 mg daily Stress levels are high but she has a good support system  Continue Zoloft 100 mg daily and work on stress reduction techniques.

## 2022-09-12 ENCOUNTER — Other Ambulatory Visit: Payer: Self-pay | Admitting: Obstetrics & Gynecology

## 2022-09-12 DIAGNOSIS — R928 Other abnormal and inconclusive findings on diagnostic imaging of breast: Secondary | ICD-10-CM

## 2022-09-16 ENCOUNTER — Other Ambulatory Visit: Payer: Self-pay | Admitting: Obstetrics and Gynecology

## 2022-09-16 DIAGNOSIS — R928 Other abnormal and inconclusive findings on diagnostic imaging of breast: Secondary | ICD-10-CM

## 2022-09-18 ENCOUNTER — Encounter: Payer: Self-pay | Admitting: Internal Medicine

## 2022-09-19 ENCOUNTER — Other Ambulatory Visit: Payer: Self-pay | Admitting: Obstetrics and Gynecology

## 2022-09-19 ENCOUNTER — Ambulatory Visit
Admission: RE | Admit: 2022-09-19 | Discharge: 2022-09-19 | Disposition: A | Discharge status: Home / Self Care | Payer: BC Managed Care – PPO | Source: Ambulatory Visit | Attending: Obstetrics and Gynecology | Admitting: Obstetrics and Gynecology

## 2022-09-19 ENCOUNTER — Other Ambulatory Visit: Payer: Self-pay | Admitting: Internal Medicine

## 2022-09-19 DIAGNOSIS — R922 Inconclusive mammogram: Secondary | ICD-10-CM | POA: Diagnosis not present

## 2022-09-19 DIAGNOSIS — R921 Mammographic calcification found on diagnostic imaging of breast: Secondary | ICD-10-CM | POA: Diagnosis not present

## 2022-09-19 DIAGNOSIS — R928 Other abnormal and inconclusive findings on diagnostic imaging of breast: Secondary | ICD-10-CM

## 2022-09-20 ENCOUNTER — Ambulatory Visit
Admission: RE | Admit: 2022-09-20 | Discharge: 2022-09-20 | Disposition: A | Discharge status: Home / Self Care | Payer: BC Managed Care – PPO | Source: Ambulatory Visit | Attending: Obstetrics and Gynecology | Admitting: Obstetrics and Gynecology

## 2022-09-20 DIAGNOSIS — N6021 Fibroadenosis of right breast: Secondary | ICD-10-CM | POA: Diagnosis not present

## 2022-09-20 DIAGNOSIS — R928 Other abnormal and inconclusive findings on diagnostic imaging of breast: Secondary | ICD-10-CM

## 2022-09-20 DIAGNOSIS — R921 Mammographic calcification found on diagnostic imaging of breast: Secondary | ICD-10-CM | POA: Diagnosis not present

## 2022-09-20 DIAGNOSIS — D241 Benign neoplasm of right breast: Secondary | ICD-10-CM | POA: Diagnosis not present

## 2022-09-20 HISTORY — PX: BREAST BIOPSY: SHX20

## 2022-10-01 ENCOUNTER — Encounter (INDEPENDENT_AMBULATORY_CARE_PROVIDER_SITE_OTHER): Payer: Self-pay | Admitting: Family Medicine

## 2022-10-01 ENCOUNTER — Ambulatory Visit (INDEPENDENT_AMBULATORY_CARE_PROVIDER_SITE_OTHER): Payer: BC Managed Care – PPO | Admitting: Family Medicine

## 2022-10-01 VITALS — BP 134/82 | HR 57 | Temp 97.7°F | Ht 61.0 in | Wt 251.0 lb

## 2022-10-01 DIAGNOSIS — I10 Essential (primary) hypertension: Secondary | ICD-10-CM | POA: Diagnosis not present

## 2022-10-01 DIAGNOSIS — R600 Localized edema: Secondary | ICD-10-CM

## 2022-10-01 DIAGNOSIS — K219 Gastro-esophageal reflux disease without esophagitis: Secondary | ICD-10-CM | POA: Insufficient documentation

## 2022-10-01 DIAGNOSIS — E669 Obesity, unspecified: Secondary | ICD-10-CM

## 2022-10-01 DIAGNOSIS — F3289 Other specified depressive episodes: Secondary | ICD-10-CM

## 2022-10-01 DIAGNOSIS — F32A Depression, unspecified: Secondary | ICD-10-CM | POA: Insufficient documentation

## 2022-10-01 DIAGNOSIS — R7303 Prediabetes: Secondary | ICD-10-CM | POA: Diagnosis not present

## 2022-10-01 DIAGNOSIS — R0602 Shortness of breath: Secondary | ICD-10-CM

## 2022-10-01 DIAGNOSIS — Z6841 Body Mass Index (BMI) 40.0 and over, adult: Secondary | ICD-10-CM | POA: Insufficient documentation

## 2022-10-01 DIAGNOSIS — R5383 Other fatigue: Secondary | ICD-10-CM | POA: Diagnosis not present

## 2022-10-01 DIAGNOSIS — Z1331 Encounter for screening for depression: Secondary | ICD-10-CM | POA: Insufficient documentation

## 2022-10-01 DIAGNOSIS — G4733 Obstructive sleep apnea (adult) (pediatric): Secondary | ICD-10-CM

## 2022-10-02 LAB — COMPREHENSIVE METABOLIC PANEL
ALT: 20 IU/L (ref 0–32)
AST: 13 IU/L (ref 0–40)
Albumin: 4.6 g/dL (ref 3.8–4.9)
Alkaline Phosphatase: 110 IU/L (ref 44–121)
BUN/Creatinine Ratio: 17 (ref 9–23)
BUN: 13 mg/dL (ref 6–24)
Bilirubin Total: 0.6 mg/dL (ref 0.0–1.2)
CO2: 25 mmol/L (ref 20–29)
Calcium: 9.9 mg/dL (ref 8.7–10.2)
Chloride: 103 mmol/L (ref 96–106)
Creatinine, Ser: 0.75 mg/dL (ref 0.57–1.00)
Globulin, Total: 2.4 g/dL (ref 1.5–4.5)
Glucose: 91 mg/dL (ref 70–99)
Potassium: 4.4 mmol/L (ref 3.5–5.2)
Sodium: 144 mmol/L (ref 134–144)
Total Protein: 7 g/dL (ref 6.0–8.5)
eGFR: 93 mL/min/{1.73_m2} (ref >59)

## 2022-10-02 LAB — HEMOGLOBIN A1C
Est. average glucose Bld gHb Est-mCnc: 128 mg/dL
Hgb A1c MFr Bld: 6.1 % — ABNORMAL HIGH (ref 4.8–5.6)

## 2022-10-02 LAB — VITAMIN B12: Vitamin B-12: 1059 pg/mL (ref 232–1245)

## 2022-10-02 LAB — VITAMIN D 25 HYDROXY (VIT D DEFICIENCY, FRACTURES): Vit D, 25-Hydroxy: 51.5 ng/mL (ref 30.0–100.0)

## 2022-10-02 LAB — INSULIN, RANDOM: INSULIN: 17.2 u[IU]/mL (ref 2.6–24.9)

## 2022-10-02 LAB — FOLATE: Folate: 4.3 ng/mL (ref >3.0)

## 2022-10-02 NOTE — Progress Notes (Unsigned)
Chief Complaint:   OBESITY Andrea Thompson (MR# [id number]) is a 57 y.o. female who presents for evaluation and treatment of obesity and related comorbidities. Current BMI is Body mass index is 47.43 kg/m. [name] has been struggling with her weight for many years and has been unsuccessful in either losing weight, maintaining weight loss, or reaching her healthy weight goal.  [name] is currently in the action stage of change and ready to dedicate time achieving and maintaining a healthier weight. [name] is interested in becoming our patient and working on intensive lifestyle modifications including (but not limited to) diet and exercise for weight loss.  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  She lives with her husband, and has 3 grown children.  She craves snacks at night, drinks juice, sweet tea, and likes candy.  She does not exercise, and she is a stress eater.  She recently tried Diethylpropion 75 mg with no success.   Zykiria's habits were reviewed today and are as follows: Her family eats meals together, she thinks her family will eat healthier with her, her desired weight loss is 91 lbs, she has been heavy most of her life, she started gaining weight with stress eating, her heaviest weight ever was 255 pounds, she has significant food cravings issues, she snacks frequently in the evenings, she is frequently drinking liquids with calories, she frequently makes poor food choices, she has problems with excessive hunger, she frequently eats larger portions than normal, and she struggles with emotional eating.  Depression Screen Jozlin's Food and Mood (modified PHQ-9) score was 22.  Subjective:   1. Other fatigue Andrea Thompson admits to daytime somnolence and admits to waking up still tired. Patient has a history of symptoms of daytime fatigue and morning fatigue. [name] generally gets 4 or 5 hours of sleep per night, and states that she has nightime awakenings. Snoring is  present. Apneic episodes are present. [name] is 13.  EKG was reviewed from 09/10/2022.  2. SOBOE (shortness of breath on exertion) [name] notes increasing shortness of breath with exercising and seems to be worsening over time with weight gain. She notes getting out of breath sooner with activity than she used to. This has not gotten worse recently. [name] denies shortness of breath at rest or orthopnea.  3. Essential hypertension Patient's blood pressure is fairly controlled on nebivolol 10 mg once daily and hydrochlorothiazide 25 mg once daily.  She denies chest pain or shortness of breath.  She has a history of hypokalemia on diuretics.  4. Pre-diabetes Patient reports a history of prediabetes, but no recent A1c listed in her chart.  5. Gastroesophageal reflux disease, unspecified whether esophagitis present Patient's symptoms are well-controlled on pantoprazole 40 mg once daily.  6. Bilateral lower extremity edema Patient wears compression socks.  Worse with weight gain and sedentary lifestyle.  7. OSA on CPAP Patient's Epworth score is 13.  She is getting 4 to 5 hours of sleep per night.  She reports use of CPAP nightly.  8. Other depression Patient's bariatric PHQ-9 score is 22.  She is on sertraline 100 mg daily.  Assessment/Plan:   1. Other fatigue Andrea Thompson does feel that her weight is causing her energy to be lower than it should be. Fatigue may be related to obesity, depression or many other causes. Labs will be ordered, and in the meanwhile, [name] will focus on self care including making healthy food choices, increasing physical activity and focusing on stress  reduction.  - VITAMIN D 25 Hydroxy (Vit-D Deficiency, Fractures) - Folate - Comprehensive metabolic panel - Vitamin B12  2. SOBOE (shortness of breath on exertion) Andrea Thompson does feel that she gets out of breath more easily that she used to when she exercises. Andrea Thompson's shortness of breath appears to be  obesity related and exercise induced. She has agreed to work on weight loss and gradually increase exercise to treat her exercise induced shortness of breath. Will continue to monitor closely.  3. Essential hypertension Patient will continue her current medications, and we will look for blood pressure improvements with weight loss.  4. Pre-diabetes We will check labs today.  Patient will be again prescribed meal plan low in sugar.  - Insulin, random - Hemoglobin A1c  5. Gastroesophageal reflux disease, unspecified whether esophagitis present Look for heartburn improvements with weight loss and dietary changes.  6. Bilateral lower extremity edema We will check labs today.  Patient is to look for improvements with weight loss.  7. OSA on CPAP Patient is to work on increasing her sleep hours to 7 hours per night with CPAP.  8. Other depression Patient is to continue her current medications, and add cognitive behavioral therapy with Dr. Dewaine Conger.  9. Depression screen Andrea Thompson had a positive depression screening. Depression is commonly associated with obesity and often results in emotional eating behaviors. We will monitor this closely and work on CBT to help improve the non-hunger eating patterns. Referral to Psychology may be required if no improvement is seen as she continues in our clinic.  10. BMI 45.0-49.9, adult (HCC)  11. Obesity, with starting BMI 47.6 Andrea Thompson is currently in the action stage of change and her goal is to continue with weight loss efforts. I recommend [name] begin the structured treatment plan as follows:  She has agreed to the Category 2 Plan.  200 calorie snack list was given.   Exercise goals: All adults should avoid inactivity. Some physical activity is better than none, and adults who participate in any amount of physical activity gain some health benefits.   Behavioral modification strategies: increasing lean protein intake, increasing water intake,  decreasing liquid calories, decreasing eating out, no skipping meals, meal planning and cooking strategies, keeping healthy foods in the home, better snacking choices, and planning for success.  She was informed of the importance of frequent follow-up visits to maximize her success with intensive lifestyle modifications for her multiple health conditions. She was informed we would discuss her lab results at her next visit unless there is a critical issue that needs to be addressed sooner. Jailah agreed to keep her next visit at the agreed upon time to discuss these results.  Objective:   Blood pressure 134/82, pulse (!) 57, temperature 97.7 F (36.5 C), height 5\' 1"  (1.549 m), weight 251 lb (113.9 kg), SpO2 95 %. Body mass index is 47.43 kg/m.  EKG: Normal sinus rhythm, rate 64 BPM.  Indirect Calorimeter completed today shows a VO2 of 261 and a REE of 1800.  Her calculated basal metabolic rate is 2956 thus her basal metabolic rate is better than expected.  General: Cooperative, alert, well developed, in no acute distress. HEENT: Conjunctivae and lids unremarkable. Cardiovascular: Regular rhythm.  Lungs: Normal work of breathing. Neurologic: No focal deficits.   Lab Results  Component Value Date   CREATININE 0.75 10/01/2022   BUN 13 10/01/2022   NA 144 10/01/2022   K 4.4 10/01/2022   CL 103 10/01/2022   CO2 25 10/01/2022  Lab Results  Component Value Date   ALT 20 10/01/2022   AST 13 10/01/2022   ALKPHOS 110 10/01/2022   BILITOT 0.6 10/01/2022   Lab Results  Component Value Date   HGBA1C 6.1 (H) 10/01/2022   HGBA1C 5.1 01/17/2017   Lab Results  Component Value Date   INSULIN 17.2 10/01/2022   Lab Results  Component Value Date   TSH 2.15 07/22/2022   Lab Results  Component Value Date   CHOL 125 07/22/2022   HDL 40 (L) 07/22/2022   LDLCALC 67 07/22/2022   TRIG 95 07/22/2022   CHOLHDL 3.1 07/22/2022   Lab Results  Component Value Date   WBC 7.0 09/06/2022    HGB 12.6 09/06/2022   HCT 36.9 09/06/2022   MCV 83.7 09/06/2022   PLT 236 09/06/2022   No results found for: "IRON", "TIBC", "FERRITIN"  Attestation Statements:   Reviewed by clinician on day of visit: allergies, medications, problem list, medical history, surgical history, family history, social history, and previous encounter notes.  Time spent on visit including pre-visit chart review and post-visit charting and care was 40 minutes.   Trude Mcburney, am acting as transcriptionist for Seymour Bars, DO.  I have reviewed the above documentation for accuracy and completeness, and I agree with the above. Seymour Bars, DO

## 2022-10-22 ENCOUNTER — Ambulatory Visit (INDEPENDENT_AMBULATORY_CARE_PROVIDER_SITE_OTHER): Payer: BC Managed Care – PPO | Admitting: Family Medicine

## 2022-10-22 ENCOUNTER — Encounter (INDEPENDENT_AMBULATORY_CARE_PROVIDER_SITE_OTHER): Payer: Self-pay | Admitting: Family Medicine

## 2022-10-22 VITALS — BP 118/72 | HR 61 | Temp 97.7°F | Ht 61.0 in | Wt 241.0 lb

## 2022-10-22 DIAGNOSIS — E669 Obesity, unspecified: Secondary | ICD-10-CM

## 2022-10-22 DIAGNOSIS — E88819 Insulin resistance, unspecified: Secondary | ICD-10-CM

## 2022-10-22 DIAGNOSIS — R7303 Prediabetes: Secondary | ICD-10-CM | POA: Diagnosis not present

## 2022-10-22 DIAGNOSIS — F3289 Other specified depressive episodes: Secondary | ICD-10-CM

## 2022-10-22 DIAGNOSIS — Z6841 Body Mass Index (BMI) 40.0 and over, adult: Secondary | ICD-10-CM

## 2022-10-22 NOTE — Progress Notes (Signed)
Office: [phone]  /  Fax: [phone]  WEIGHT SUMMARY AND BIOMETRICS  Starting Date: 10/01/22  Starting Weight: 251lb   Weight Lost Since Last Visit: 10lb   Vitals Temp: 97.7 F (36.5 C) BP: 118/72 Pulse Rate: 61 SpO2: 99 %   Body Composition  Body Fat %: 52.4 % Fat Mass (lbs): 126.8 lbs Muscle Mass (lbs): 109.2 lbs Visceral Fat Rating : 18     HPI  Chief Complaint: OBESITY  Andrea Thompson is here to discuss her progress with her obesity treatment plan. She is on the the Category 2 Plan and states she is following her eating plan approximately 80 % of the time. She states she is exercising 0 minutes 0 times per week.   Interval History:  Since last office visit she is  down 10 lb She is mostly eating on plan She had one day of middle of the night hunger She has occasionally eaten out but has cut her portion sizes Sugar cravings have reduced She has been able to bypass donuts in the break room at work Husband is supportive She is off SSBs and has reduced diet soda intake She plans to do more yardwork   Pharmacotherapy: none  PHYSICAL EXAM:  Blood pressure 118/72, pulse 61, temperature 97.7 F (36.5 C), height 5\' 1"  (1.549 m), weight 241 lb (109.3 kg), SpO2 99 %. Body mass index is 45.54 kg/m.  General: She is overweight, cooperative, alert, well developed, and in no acute distress. PSYCH: Has normal mood, affect and thought process.   Lungs: Normal breathing effort, no conversational dyspnea.   ASSESSMENT AND PLAN  TREATMENT PLAN FOR OBESITY:  Recommended Dietary Goals  Andrea Thompson is currently in the action stage of change. As such, her goal is to continue weight management plan. She has agreed to the Category 2 Plan.  Behavioral Intervention  We discussed the following Behavioral Modification Strategies today: increasing lean protein intake, decreasing simple carbohydrates , increasing vegetables, increasing lower glycemic fruits, increasing water  intake, keeping healthy foods at home, identifying sources and decreasing liquid calories, work on managing stress, creating time for self-care and relaxation measures, avoiding temptations and identifying enticing environmental cues, continue to practice mindfulness when eating, and planning for success.  Additional resources provided today: NA  Recommended Physical Activity Goals  Andrea Thompson has been advised to work up to 150 minutes of moderate intensity aerobic activity a week and strengthening exercises 2-3 times per week for cardiovascular health, weight loss maintenance and preservation of muscle mass.   She has agreed to Think about ways to increase daily physical activity and overcoming barriers to exercise  Pharmacotherapy changes for the treatment of obesity: none  ASSOCIATED CONDITIONS ADDRESSED TODAY  Pre-diabetes Assessment & Plan: Lab Results  Component Value Date   HGBA1C 6.1 (H) 10/01/2022   Reviewed lab from last visit She has been in prediabetic range in past and has + fam hx of T2DM Cravings has reduced  Discussed adding metformin for PDM and IR but she declined. Will focus on prescribed meal plan + increasing walking time.   Obesity, Class III, BMI 40-49.9 (morbid obesity) (HCC)  Insulin resistance Assessment & Plan: Reviewed lab from last visit FASTING INSULIN high at 17.2 with feelings of hyperphagia. This is starting to improve with prescribed meal plan We discussed how IR affects hunger and fat deposition and ways to improve IR with exercise and a low sugar/ lower starch diet.  Continue prescribed dietary plan Increase walking time Consider metformin use  Other depression Assessment & Plan: Referral made for CBT with Dr Dewaine Conger to work on mindful eating with high stress levels and a high bariatric PHQ 9 score. Has good support system. Takes Zoloft 100 mg daily for MDD and reports improved mood  Continue to work on mindful eating, stress reduction,  keeping junk food triggers out of sight, improved sleep and nutrition.       She was informed of the importance of frequent follow up visits to maximize her success with intensive lifestyle modifications for her multiple health conditions.   ATTESTASTION STATEMENTS:  Reviewed by clinician on day of visit: allergies, medications, problem list, medical history, surgical history, family history, social history, and previous encounter notes pertinent to obesity diagnosis.   I have personally spent 30 minutes total time today in preparation, patient care, nutritional counseling and documentation for this visit, including the following: review of clinical lab tests; review of medical tests/procedures/services.      Glennis Brink, DO DABFM, DABOM [facility] [address] [place], [place] [address] [phone]

## 2022-10-22 NOTE — Assessment & Plan Note (Signed)
Referral made for CBT with Dr Dewaine Conger to work on mindful eating with high stress levels and a high bariatric PHQ 9 score. Has good support system. Takes Zoloft 100 mg daily for MDD and reports improved mood  Continue to work on mindful eating, stress reduction, keeping junk food triggers out of sight, improved sleep and nutrition.

## 2022-10-22 NOTE — Assessment & Plan Note (Signed)
Reviewed lab from last visit FASTING INSULIN high at 17.2 with feelings of hyperphagia. This is starting to improve with prescribed meal plan We discussed how IR affects hunger and fat deposition and ways to improve IR with exercise and a low sugar/ lower starch diet.  Continue prescribed dietary plan Increase walking time Consider metformin use

## 2022-10-22 NOTE — Assessment & Plan Note (Signed)
Lab Results  Component Value Date   HGBA1C 6.1 (H) 10/01/2022   Reviewed lab from last visit She has been in prediabetic range in past and has + fam hx of T2DM Cravings has reduced  Discussed adding metformin for PDM and IR but she declined. Will focus on prescribed meal plan + increasing walking time.

## 2022-11-07 ENCOUNTER — Ambulatory Visit (INDEPENDENT_AMBULATORY_CARE_PROVIDER_SITE_OTHER): Payer: BC Managed Care – PPO | Admitting: Family Medicine

## 2022-11-07 ENCOUNTER — Encounter (INDEPENDENT_AMBULATORY_CARE_PROVIDER_SITE_OTHER): Payer: Self-pay | Admitting: Family Medicine

## 2022-11-07 VITALS — BP 129/78 | HR 55 | Temp 98.5°F | Ht 61.0 in | Wt 237.0 lb

## 2022-11-07 DIAGNOSIS — E7849 Other hyperlipidemia: Secondary | ICD-10-CM

## 2022-11-07 DIAGNOSIS — I10 Essential (primary) hypertension: Secondary | ICD-10-CM

## 2022-11-07 DIAGNOSIS — R7303 Prediabetes: Secondary | ICD-10-CM | POA: Diagnosis not present

## 2022-11-07 DIAGNOSIS — E669 Obesity, unspecified: Secondary | ICD-10-CM | POA: Diagnosis not present

## 2022-11-07 DIAGNOSIS — Z6841 Body Mass Index (BMI) 40.0 and over, adult: Secondary | ICD-10-CM

## 2022-11-07 NOTE — Progress Notes (Signed)
Andrea Thompson, D.O.  ABFM, ABOM Specializing in Clinical Bariatric Medicine  Office located at: [address] [place], [place]  [address]     Assessment and Plan:   Essential hypertension Assessment & Plan: Blood pressure is stable. No concerns in this regard today. Her HTN is being treated with Hydrodiuril 25 mg daily, Lasix prn, Norvasc 5 mg daily, and  Bystolic 10 mg daily.   Last 3 blood pressure readings in our office are as follows: BP Readings from Last 3 Encounters:  11/07/22 129/78  10/22/22 118/72  10/01/22 134/82   Unless pre-existing renal or cardiopulmonary conditions exist which patient was told to limit their fluid intake by another provider, I recommended roughly one half of their weight in pounds, to be the approximate ounces of non-caloric, non-caffeinated beverages they should drink per day; including more if they are engaging in exercise.   Continue with all antihypertensive medications per Dr.Baxley. Continue with Prudent nutritional plan and low sodium diet, advance exercise as tolerated.  Reminded patient that if they ever feel poorly in any way, to check their blood pressure and pulse as well. We will continue to monitor symptoms as they relate to the her weight loss journey.    Pre-diabetes Assessment & Plan:  Condition is currently diet/exercise controlled. In the past 2-3 days, [name] endorses having  hunger an hour before lunch and then around 7 pm -8 pm.   Lab Results  Component Value Date   HGBA1C 6.1 (H) 10/01/2022   HGBA1C 5.1 01/17/2017   INSULIN 17.2 10/01/2022    Educated patient that having protein with each meal is an important part of her prediabetes management as well as for controlling hunger and cravings. In addition, we discussed Metformin which can help [place] in the management of this disease process as well as with weight loss.  Will consider starting  Metformin next OV if her hunger persists. We will focus on prudent nutritional  plan at this time. Will continue to monitor condition alongside PCP/specialists as it relates to their weight loss journey    Other hyperlipidemia - low HDL Assessment & Plan: Condition is being treated with Lipitor 40 mg daily - tolerating medication well, no concerns.   Lab Results  Component Value Date   CHOL 125 07/22/2022   HDL 40 (L) 07/22/2022   LDLCALC 67 07/22/2022   TRIG 95 07/22/2022   CHOLHDL 3.1 07/22/2022   Continue with Lipitor per PCP and our treatment plan of a heart-heathy, low cholesterol meal plan. We recommend: aerobic activity with eventual goal of a minimum of 150+ min wk plus 2 days/ week of resistance or strength training. We will continue routine screening as patient continues to achieve health goals along their weight loss journey.   TREATMENT PLAN FOR OBESITY: BMI 45.0-49.9, adult (HCC) - current BMI 44.8 Obesity, with starting BMI 47.6 Assessment & Plan:  Andrea TIPPETTS is here to discuss her progress with her obesity treatment plan along with follow-up of her obesity related diagnoses. See Medical Weight Management Flowsheet for complete bioelectrical impedance results.  Condition is not optimized. Biometric data collected today, was reviewed with patient.   Since last office visit on 10/22/22 patient's  Muscle mass has decreased by 2.6 lb. Fat mass has decreased by 1.6 lb. Counseling done on how various foods will affect these numbers and how to maximize success  Total lbs lost to date: 14 lbs  Total weight loss percentage to date: 5.58%   Advised  pt to read the nutritional menu before going out to eat and trying to order  lean proteins and or foods with a 10:1 ratio of calories to protein. Pt agrees to continue the Category 2 meal plan   Behavioral Intervention Additional resources provided today:  pt declined Evidence-based interventions for health behavior change were utilized today including the discussion of self monitoring techniques,  problem-solving barriers and SMART goal setting techniques.   Regarding patient's less desirable eating habits and patterns, we employed the technique of small changes.  Pt will specifically work on: practicing mindfulness when eating out  and increasing lean protein intake for next visit.    Recommended Physical Activity Goals  Andrea Thompson has been advised to slowly work up to 150 minutes of moderate intensity aerobic activity a week and strengthening exercises 2-3 times per week for cardiovascular health, weight loss maintenance and preservation of muscle mass. She has agreed to Continue current level of physical activity   FOLLOW UP: Return in about 2 weeks (around 11/21/2022). She was informed of the importance of frequent follow up visits to maximize her success with intensive lifestyle modifications for her multiple health conditions.  Subjective:   Chief complaint: Obesity Ameri is here to discuss her progress with her obesity treatment plan. She is on the Category 2 Plan and states she is following her eating plan approximately 70% of the time. She states she is not exercising.  Interval History:  Andrea Thompson is here for a follow up office visit. This my first time meeting with [name]; she has seen Dr.Bowen in her first 3 visits. Since last last OV, Andrea Thompson endorses having hunger 1 hr before lunch and around 7 pm - 8 pm. She endorses not eating 4 ounces of lean protein at lunch. Typically drinks 160 cal, 30 gram protein shake for lunch. Overall, endorses not quite eating all the food on the plan.  Review of Systems:  Pertinent positives were addressed with patient today.  Reviewed by clinician on day of visit: allergies, medications, problem list, medical history, surgical history, family history, social history, and previous encounter notes.  Weight Summary and Biometrics   Weight Lost Since Last Visit: 4lb  Weight Gained Since Last Visit: 0lb   Vitals Temp: 98.5 F (36.9 C) BP:  129/78 Pulse Rate: (!) 55 SpO2: 98 %   Anthropometric Measurements Height: 5\' 1"  (1.549 m) Weight: 237 lb (107.5 kg) BMI (Calculated): 44.8 Weight at Last Visit: 241lb Weight Lost Since Last Visit: 4lb Weight Gained Since Last Visit: 0lb Starting Weight: 251lb Total Weight Loss (lbs): 14 lb (6.35 kg) Peak Weight: 254lb   Body Composition  Body Fat %: 52.7 % Fat Mass (lbs): 125.2 lbs Muscle Mass (lbs): 106.6 lbs Visceral Fat Rating : 18   Other Clinical Data Fasting: no Labs: no Today's Visit #: 3 Starting Date: 09/14/22   Objective:   PHYSICAL EXAM: Blood pressure 129/78, pulse (!) 55, temperature 98.5 F (36.9 C), height 5\' 1"  (1.549 m), weight 237 lb (107.5 kg), SpO2 98%. Body mass index is 44.78 kg/m.  General: Well Developed, well nourished, and in no acute distress.  HEENT: Normocephalic, atraumatic Skin: Warm and dry, cap RF less 2 sec, good turgor Chest:  Normal excursion, shape, no gross abn Respiratory: speaking in full sentences, no conversational dyspnea NeuroM-Sk: Ambulates w/o assistance, moves * 4 Psych: A and O *3, insight good, mood-full  DIAGNOSTIC DATA REVIEWED:  BMET    Component Value Date/Time   NA 144 10/01/2022 1046  K 4.4 10/01/2022 1046   CL 103 10/01/2022 1046   CO2 25 10/01/2022 1046   GLUCOSE 91 10/01/2022 1046   GLUCOSE 140 (H) 09/06/2022 1204   BUN 13 10/01/2022 1046   CREATININE 0.75 10/01/2022 1046   CREATININE 0.66 07/22/2022 1111   CALCIUM 9.9 10/01/2022 1046   GFRNONAA >60 09/06/2022 1204   GFRNONAA 99 01/25/2020 0931   GFRAA 115 01/25/2020 0931   Lab Results  Component Value Date   HGBA1C 6.1 (H) 10/01/2022   HGBA1C 5.1 01/17/2017   Lab Results  Component Value Date   INSULIN 17.2 10/01/2022   Lab Results  Component Value Date   TSH 2.15 07/22/2022   CBC    Component Value Date/Time   WBC 7.0 09/06/2022 1204   RBC 4.41 09/06/2022 1204   HGB 12.6 09/06/2022 1204   HCT 36.9 09/06/2022 1204   PLT  236 09/06/2022 1204   MCV 83.7 09/06/2022 1204   MCH 28.6 09/06/2022 1204   MCHC 34.1 09/06/2022 1204   RDW 13.0 09/06/2022 1204   Iron Studies No results found for: "IRON", "TIBC", "FERRITIN", "IRONPCTSAT" Lipid Panel     Component Value Date/Time   CHOL 125 07/22/2022 1111   TRIG 95 07/22/2022 1111   HDL 40 (L) 07/22/2022 1111   CHOLHDL 3.1 07/22/2022 1111   VLDL 25 01/17/2017 0522   LDLCALC 67 07/22/2022 1111   Hepatic Function Panel     Component Value Date/Time   PROT 7.0 10/01/2022 1046   ALBUMIN 4.6 10/01/2022 1046   AST 13 10/01/2022 1046   ALT 20 10/01/2022 1046   ALKPHOS 110 10/01/2022 1046   BILITOT 0.6 10/01/2022 1046   BILIDIR 0.1 07/22/2019 0903   IBILI 0.4 07/22/2019 0903      Component Value Date/Time   TSH 2.15 07/22/2022 1111   Nutritional Lab Results  Component Value Date   VD25OH 51.5 10/01/2022   VD25OH 33 01/26/2019   VD25OH 37 07/29/2017    Attestations:   Patient was in the office today and time spent on visit including pre-visit chart review and post-visit care/coordination of care and electronic medical record documentation was 30 minutes. 50% of that time was in face to face counseling of this patient's medical condition(s) and providing education on treatment options to always include the first-line treatment of diet and lifestyle modification.   I, Special Randolm Idol , acting as a Stage manager for [employer], DO., have compiled all relevant documentation for today's office visit on behalf of Thomasene Lot, DO, while in the presence of [employer], DO.  I have reviewed the above documentation for accuracy and completeness, and I agree with the above. Andrea Thompson, D.O.  The 21st Century Cures Act was signed into law in 2016 which includes the topic of electronic health records.  This provides immediate access to information in MyChart.  This includes consultation notes, operative notes, office notes, lab results and pathology  reports.  If you have any questions about what you read please let us know at your next visit so we can discuss your concerns and take corrective action if need be.  We are right here with you.

## 2022-11-17 ENCOUNTER — Other Ambulatory Visit: Payer: Self-pay | Admitting: Internal Medicine

## 2022-11-18 ENCOUNTER — Ambulatory Visit (INDEPENDENT_AMBULATORY_CARE_PROVIDER_SITE_OTHER): Payer: BC Managed Care – PPO | Admitting: Family Medicine

## 2022-11-20 ENCOUNTER — Other Ambulatory Visit (HOSPITAL_BASED_OUTPATIENT_CLINIC_OR_DEPARTMENT_OTHER): Payer: Self-pay

## 2022-11-20 ENCOUNTER — Ambulatory Visit (INDEPENDENT_AMBULATORY_CARE_PROVIDER_SITE_OTHER): Payer: BC Managed Care – PPO | Admitting: Family Medicine

## 2022-11-20 ENCOUNTER — Encounter (INDEPENDENT_AMBULATORY_CARE_PROVIDER_SITE_OTHER): Payer: Self-pay | Admitting: Family Medicine

## 2022-11-20 DIAGNOSIS — E7849 Other hyperlipidemia: Secondary | ICD-10-CM

## 2022-11-20 DIAGNOSIS — Z6841 Body Mass Index (BMI) 40.0 and over, adult: Secondary | ICD-10-CM

## 2022-11-20 DIAGNOSIS — I10 Essential (primary) hypertension: Secondary | ICD-10-CM

## 2022-11-20 DIAGNOSIS — R7303 Prediabetes: Secondary | ICD-10-CM | POA: Diagnosis not present

## 2022-11-20 DIAGNOSIS — E669 Obesity, unspecified: Secondary | ICD-10-CM | POA: Diagnosis not present

## 2022-11-20 NOTE — Progress Notes (Signed)
Andrea Thompson, D.O.  ABFM, ABOM Clinical Bariatric Medicine Physician  Office located at: [address] [place], [place]  [address]     Assessment and Plan:   Essential hypertension Assessment: Condition is Controlled.. She continues antihypertensive medications such as Hydrodiuri daily, Lasix prn, Norvasc daily, and Bystolic daily. She is tolerating these medications well and denies any adverse side effects.  Last 3 blood pressure readings in our office are as follows: BP Readings from Last 3 Encounters:  11/20/22 128/87  11/07/22 129/78  10/22/22 118/72    Plan: - Continue Hydrodiuril 25 mg daily, Lasix prn, Norvasc 5 mg daily, and Bystolic 10 mg daily.   - BP is 128/87 at goal today.   - Ambulatory blood pressure monitoring encouraged.    - Lifestyle changes such as following our low salt, heart healthy meal plan and engaging in a regular exercise program discussed   - We will continue to monitor closely alongside PCP/ specialists.  Pt reminded to also f/up with those individuals as instructed by them.    Other hyperlipidemia - low HDL Assessment: Condition is Not optimized.. She continues Lipitor and tolerates this well with no adverse side effects.  Lab Results  Component Value Date   CHOL 125 07/22/2022   HDL 40 (L) 07/22/2022   LDLCALC 67 07/22/2022   TRIG 95 07/22/2022   CHOLHDL 3.1 07/22/2022   Plan: - Continue Lipitor 40mg  daily.   - We discussed several lifestyle modifications today and she will continue to work on diet, exercise and weight loss efforts.   - I stressed the importance that patient continue with our prudent nutritional plan that is low in saturated and trans fats, and low in fatty carbs to improve these numbers.   - We will continue to monitor her levels/symptoms and adjust her treatment plan as deemed clinically necessary.    Pre-diabetes Assessment: Condition is Not optimized.. This is diet/exercise controlled. Patient endorses  having less hunger and cravings since starting to eat more and more regularly. She monitors her blood sugar at home.  Lab Results  Component Value Date   HGBA1C 6.1 (H) 10/01/2022   HGBA1C 5.1 01/17/2017   INSULIN 17.2 10/01/2022    Plan: - Continue her prudent nutritional plan that is low in simple carbohydrates, saturated fats and trans fats to goal of 5-10% weight loss to achieve significant health benefits.  Pt encouraged to continually advance exercise and cardiovascular fitness as tolerated throughout weight loss journey.  - Continue to decrease simple carbs/ sugars; increase fiber and proteins -> follow her meal plan.   - Explained role of simple carbs and insulin levels on hunger and cravings  - Anticipatory guidance given.    Andrea Thompson will continue to work on weight loss, exercise, via their meal plan we devised to help decrease the risk of progressing to diabetes.   - We will recheck A1c and fasting insulin level in approximately 3 months from last check, or as deemed appropriate.    TREATMENT PLAN FOR OBESITY: Obesity, with starting BMI 47.6 BMI 40.0-44.9, adult (HCC)-current 43.67 Assessment:  Andrea Thompson is here to discuss her progress with her obesity treatment plan along with follow-up of her obesity related diagnoses. See Medical Weight Management Flowsheet for complete bioelectrical impedance results.  Condition is docourse: improving. Biometric data collected today, was reviewed with patient.   Since last office visit patient's  Muscle mass has increased by 0.8lb. Fat mass has decreased by 6.8lb.  Counseling done on how various foods will affect these numbers and how to maximize success  Total lbs lost to date: +16 Total weight loss percentage to date: +7.44%   Plan: - Continue to follow the prescribed category 2 meal plan.   - I informed her about doing the 10:1 ratio when considering her protein intake, and I also recommended Thick crunch protein bars.     Behavioral Intervention Additional resources provided today: patient declined Evidence-based interventions for health behavior change were utilized today including the discussion of self monitoring techniques, problem-solving barriers and SMART goal setting techniques.   Regarding patient's less desirable eating habits and patterns, we employed the technique of small changes.  Pt will specifically work on: Continue to follow our prudent nutritional meal plan for next visit.    Recommended Physical Activity Goals  Andrea Thompson has been advised to slowly work up to 150 minutes of moderate intensity aerobic activity a week and strengthening exercises 2-3 times per week for cardiovascular health, weight loss maintenance and preservation of muscle mass.   She has agreed to Continue current level of physical activity    FOLLOW UP:  She was informed of the importance of frequent follow up visits to maximize her success with intensive lifestyle modifications for her multiple health conditions.  Subjective:   Chief complaint: Obesity Andrea Thompson is here to discuss her progress with her obesity treatment plan. She is on the the Category 2 Plan and states she is following her eating plan approximately 90 % of the time. She states she is exercising 60 minutes 2-3 days per week.  Interval History:  Andrea Thompson is here today for her a follow up office visit. Since last office visit she has made a effort to consistently stick to her plan as she does not want to become insulin dependent as her husband already is. She states that her knees don't hurt and ache as bad anymore and she loves laughing cow cheese that was recommended last visit. She tends to snack on cucumbers and protein bars.     Review of Systems:  Pertinent positives were addressed with patient today.   Weight Summary and Biometrics   Weight Lost Since Last Visit: 6lb  Weight Gained Since Last Visit: 0lb   Vitals Temp: 98.1 F (36.7  C) BP: 128/87 Pulse Rate: 79 SpO2: 96 %   Anthropometric Measurements Height: 5\' 1"  (1.549 m) Weight: 231 lb (104.8 kg) BMI (Calculated): 43.67 Weight at Last Visit: 237lb Weight Lost Since Last Visit: 6lb Weight Gained Since Last Visit: 0lb Starting Weight: 251lb Total Weight Loss (lbs): 20 lb (9.072 kg) Peak Weight: 254lb   Body Composition  Body Fat %: 51.1 % Fat Mass (lbs): 118.4 lbs Muscle Mass (lbs): 107.4 lbs Total Body Water (lbs): 89.8 lbs Visceral Fat Rating : 17   Other Clinical Data Fasting: no Labs: no Today's Visit #: 4 Starting Date: 09/14/22     Objective:   PHYSICAL EXAM:  Blood pressure 128/87, pulse 79, temperature 98.1 F (36.7 C), height 5\' 1"  (1.549 m), weight 231 lb (104.8 kg), SpO2 96%. Body mass index is 43.65 kg/m.  General: Well Developed, well nourished, and in no acute distress.  HEENT: Normocephalic, atraumatic Skin: Warm and dry, cap RF less 2 sec, good turgor Chest:  Normal excursion, shape, no gross abn Respiratory: speaking in full sentences, no conversational dyspnea NeuroM-Sk: Ambulates w/o assistance, moves * 4 Psych: A and O *3, insight good, mood-full  DIAGNOSTIC DATA REVIEWED:  BMET  Component Value Date/Time   NA 144 10/01/2022 1046   K 4.4 10/01/2022 1046   CL 103 10/01/2022 1046   CO2 25 10/01/2022 1046   GLUCOSE 91 10/01/2022 1046   GLUCOSE 140 (H) 09/06/2022 1204   BUN 13 10/01/2022 1046   CREATININE 0.75 10/01/2022 1046   CREATININE 0.66 07/22/2022 1111   CALCIUM 9.9 10/01/2022 1046   GFRNONAA >60 09/06/2022 1204   GFRNONAA 99 01/25/2020 0931   GFRAA 115 01/25/2020 0931   Lab Results  Component Value Date   HGBA1C 6.1 (H) 10/01/2022   HGBA1C 5.1 01/17/2017   Lab Results  Component Value Date   INSULIN 17.2 10/01/2022   Lab Results  Component Value Date   TSH 2.15 07/22/2022   CBC    Component Value Date/Time   WBC 7.0 09/06/2022 1204   RBC 4.41 09/06/2022 1204   HGB 12.6  09/06/2022 1204   HCT 36.9 09/06/2022 1204   PLT 236 09/06/2022 1204   MCV 83.7 09/06/2022 1204   MCH 28.6 09/06/2022 1204   MCHC 34.1 09/06/2022 1204   RDW 13.0 09/06/2022 1204   Iron Studies No results found for: "IRON", "TIBC", "FERRITIN", "IRONPCTSAT" Lipid Panel     Component Value Date/Time   CHOL 125 07/22/2022 1111   TRIG 95 07/22/2022 1111   HDL 40 (L) 07/22/2022 1111   CHOLHDL 3.1 07/22/2022 1111   VLDL 25 01/17/2017 0522   LDLCALC 67 07/22/2022 1111   Hepatic Function Panel     Component Value Date/Time   PROT 7.0 10/01/2022 1046   ALBUMIN 4.6 10/01/2022 1046   AST 13 10/01/2022 1046   ALT 20 10/01/2022 1046   ALKPHOS 110 10/01/2022 1046   BILITOT 0.6 10/01/2022 1046   BILIDIR 0.1 07/22/2019 0903   IBILI 0.4 07/22/2019 0903      Component Value Date/Time   TSH 2.15 07/22/2022 1111   Nutritional Lab Results  Component Value Date   VD25OH 51.5 10/01/2022   VD25OH 33 01/26/2019   VD25OH 37 07/29/2017    Attestations:   Reviewed by clinician on day of visit: allergies, medications, problem list, medical history, surgical history, family history, social history, and previous encounter notes.   I, Clinical biochemist, acting as a Stage manager for [employer], DO., have compiled all relevant documentation for today's office visit on behalf of Thomasene Lot, DO, while in the presence of [employer], DO.  I have reviewed the above documentation for accuracy and completeness, and I agree with the above. Andrea Thompson, D.O.  The 21st Century Cures Act was signed into law in 2016 which includes the topic of electronic health records.  This provides immediate access to information in MyChart.  This includes consultation notes, operative notes, office notes, lab results and pathology reports.  If you have any questions about what you read please let us know at your next visit so we can discuss your concerns and take corrective action if need be.  We are  right here with you.

## 2022-12-25 ENCOUNTER — Ambulatory Visit (INDEPENDENT_AMBULATORY_CARE_PROVIDER_SITE_OTHER): Payer: BC Managed Care – PPO | Admitting: Family Medicine

## 2023-01-21 ENCOUNTER — Encounter (INDEPENDENT_AMBULATORY_CARE_PROVIDER_SITE_OTHER): Payer: Self-pay | Admitting: Adult Health

## 2023-01-21 ENCOUNTER — Ambulatory Visit (INDEPENDENT_AMBULATORY_CARE_PROVIDER_SITE_OTHER): Payer: BC Managed Care – PPO | Admitting: Adult Health

## 2023-01-21 DIAGNOSIS — I10 Essential (primary) hypertension: Secondary | ICD-10-CM

## 2023-01-21 DIAGNOSIS — Z6841 Body Mass Index (BMI) 40.0 and over, adult: Secondary | ICD-10-CM | POA: Diagnosis not present

## 2023-01-21 DIAGNOSIS — E66813 Obesity, class 3: Secondary | ICD-10-CM

## 2023-01-21 DIAGNOSIS — R7303 Prediabetes: Secondary | ICD-10-CM

## 2023-01-21 NOTE — Progress Notes (Signed)
WEIGHT SUMMARY AND BIOMETRICS  Vitals Temp: 97.9 F (36.6 C) BP: 122/73 Pulse Rate: (!) 51 SpO2: 95 %   Anthropometric Measurements Height: 5\' 1"  (1.549 m) Weight: 221 lb (100.2 kg) BMI (Calculated): 41.78 Weight at Last Visit: 231lb Weight Lost Since Last Visit: 10lb Weight Gained Since Last Visit: 0 Starting Weight: 251lb Total Weight Loss (lbs): 30 lb (13.6 kg)   Body Composition  Body Fat %: 50.5 % Fat Mass (lbs): 111.8 lbs Muscle Mass (lbs): 104 lbs Visceral Fat Rating : 16   Other Clinical Data Fasting: no Labs: no Today's Visit #: 5 Starting Date: 09/14/22    Chief Complaint:   OBESITY Andrea Thompson is here to discuss her progress with her obesity treatment plan. She is on the the Category 2 Plan and states she is following her eating plan approximately 75 % of the time. She states she is exercising Walking 10-15 minutes 5 times per week.   Interim History:  Reviewed Bioempedence results with pt: Muscle Mass: -3.4 lbs Adipose Mass: -6.6 lbs She started program with Visceral Adipose Rating 20 Currently her Visceral Adipose Rating has reduced to 16- great job!  Subjective:   1. Essential hypertension BP at goal at OV PCP/Dr. Lenord Fellers manages aspirin 81 MG EC tablet  furosemide (LASIX) 20 MG tablet  hydrochlorothiazide (HYDRODIURIL) 25 MG tablet  amLODipine (NORVASC) 5 MG tablet  atorvastatin (LIPITOR) 40 MG tablet  nebivolol (BYSTOLIC) 10 MG tablet   She denies sx's of hypotension She has lost 40 lbs! Discussed that we will closely monitor BP trends and for development of hypotension sx's  2. Pre Diabetes  Lab Results  Component Value Date   HGBA1C 6.1 (H) 10/01/2022   HGBA1C 5.1 01/17/2017    Family hx- her mother has T2D She is not currently on any antidiabetic medications Her fasting CBG were running 130s summer 2024. Her last check 103 over the last weekend.  Assessment/Plan:   1. Essential hypertension Remain well hydrated Check  BP/HR few times a week - record and bring log to each [facility] appt  2. Pre Diabetes Continue Cat 2 Meal plan and daily Walking!  3.Obesity, Class III, BMI 40-49.9 (morbid obesity) (HCC), Current BMI 41.78  Tequila is currently in the action stage of change. As such, her goal is to continue with weight loss efforts. She has agreed to the Category 2 Plan.   Exercise goals: For substantial health benefits, adults should do at least 150 minutes (2 hours and 30 minutes) a week of moderate-intensity, or 75 minutes (1 hour and 15 minutes) a week of vigorous-intensity aerobic physical activity, or an equivalent combination of moderate- and vigorous-intensity aerobic activity. Aerobic activity should be performed in episodes of at least 10 minutes, and preferably, it should be spread throughout the week.  Behavioral modification strategies: increasing lean protein intake, decreasing simple carbohydrates, increasing vegetables, increasing water intake, no skipping meals, meal planning and cooking strategies, and planning for success.  Mitsuye has agreed to follow-up with our clinic in 4 weeks. She was informed of the importance of frequent follow-up visits to maximize her success with intensive lifestyle modifications for her multiple health conditions.   Objective:   Blood pressure 122/73, pulse (!) 51, temperature 97.9 F (36.6 C), height 5\' 1"  (1.549 m), weight 221 lb (100.2 kg), SpO2 95%. Body mass index is 41.76 kg/m.  General: Cooperative, alert, well developed, in no acute distress. HEENT: Conjunctivae and lids unremarkable. Cardiovascular: Regular rhythm.  Lungs: Normal work of  breathing. Neurologic: No focal deficits.   Lab Results  Component Value Date   CREATININE 0.75 10/01/2022   BUN 13 10/01/2022   NA 144 10/01/2022   K 4.4 10/01/2022   CL 103 10/01/2022   CO2 25 10/01/2022   Lab Results  Component Value Date   ALT 20 10/01/2022   AST 13 10/01/2022   ALKPHOS 110 10/01/2022    BILITOT 0.6 10/01/2022   Lab Results  Component Value Date   HGBA1C 6.1 (H) 10/01/2022   HGBA1C 5.1 01/17/2017   Lab Results  Component Value Date   INSULIN 17.2 10/01/2022   Lab Results  Component Value Date   TSH 2.15 07/22/2022   Lab Results  Component Value Date   CHOL 125 07/22/2022   HDL 40 (L) 07/22/2022   LDLCALC 67 07/22/2022   TRIG 95 07/22/2022   CHOLHDL 3.1 07/22/2022   Lab Results  Component Value Date   VD25OH 51.5 10/01/2022   VD25OH 33 01/26/2019   VD25OH 37 07/29/2017   Lab Results  Component Value Date   WBC 7.0 09/06/2022   HGB 12.6 09/06/2022   HCT 36.9 09/06/2022   MCV 83.7 09/06/2022   PLT 236 09/06/2022   No results found for: "IRON", "TIBC", "FERRITIN"   Attestation Statements:   Reviewed by clinician on day of visit: allergies, medications, problem list, medical history, surgical history, family history, social history, and previous encounter notes.  Time spent on visit including pre-visit chart review and post-visit care and charting was 28 minutes.   I have reviewed the above documentation for accuracy and completeness, and I agree with the above. -  Evynn Boutelle d. Weber Monnier, NP-C

## 2023-02-16 ENCOUNTER — Other Ambulatory Visit: Payer: Self-pay | Admitting: Internal Medicine

## 2023-02-19 ENCOUNTER — Ambulatory Visit (INDEPENDENT_AMBULATORY_CARE_PROVIDER_SITE_OTHER): Payer: BC Managed Care – PPO | Admitting: Adult Health

## 2023-03-19 ENCOUNTER — Other Ambulatory Visit: Payer: Self-pay | Admitting: Internal Medicine

## 2023-03-19 ENCOUNTER — Ambulatory Visit (INDEPENDENT_AMBULATORY_CARE_PROVIDER_SITE_OTHER): Payer: Self-pay | Admitting: Adult Health

## 2023-03-19 ENCOUNTER — Encounter (INDEPENDENT_AMBULATORY_CARE_PROVIDER_SITE_OTHER): Payer: Self-pay | Admitting: Adult Health

## 2023-03-19 VITALS — BP 124/75 | HR 61 | Temp 97.4°F | Ht 61.0 in | Wt 224.0 lb

## 2023-03-19 DIAGNOSIS — E669 Obesity, unspecified: Secondary | ICD-10-CM | POA: Diagnosis not present

## 2023-03-19 DIAGNOSIS — Z6841 Body Mass Index (BMI) 40.0 and over, adult: Secondary | ICD-10-CM

## 2023-03-19 DIAGNOSIS — R7303 Prediabetes: Secondary | ICD-10-CM

## 2023-03-19 DIAGNOSIS — I10 Essential (primary) hypertension: Secondary | ICD-10-CM

## 2023-03-19 NOTE — Progress Notes (Signed)
WEIGHT SUMMARY AND BIOMETRICS  Vitals Temp: (!) 97.4 F (36.3 C) BP: 124/75 Pulse Rate: 61 SpO2: 98 %   Anthropometric Measurements Height: 5\' 1"  (1.549 m) Weight: 224 lb (101.6 kg) BMI (Calculated): 42.35 Weight at Last Visit: 221lb Weight Lost Since Last Visit: 0 Weight Gained Since Last Visit: 3lb Starting Weight: 251lb Total Weight Loss (lbs): 27 lb (12.2 kg)   Body Composition  Body Fat %: 51.2 % Fat Mass (lbs): 114.6 lbs Muscle Mass (lbs): 103.8 lbs Visceral Fat Rating : 17   Other Clinical Data Fasting: no Labs: no Today's Visit #: 6 Starting Date: 09/14/22    Chief Complaint:   OBESITY Andrea Thompson is here to discuss her progress with her obesity treatment plan. She is on the the Category 2 Plan and states she is following her eating plan approximately 0 % of the time.  She states she is not currently exercising.   Interim History:  Andrea Thompson endorses increased eating out the last month, ie: [employer], [employer] She celebrated her 57th birthday on the 1st of this month. She shares that the holidays are a challenging time to eat on her prescribed Cat 2 Meal plan. Discussed other meal plan options that may work better during this time of year. Interval Goal: Maintain weight over Dec and into the New Year.  Subjective:   1. Essential hypertension BP at goal at OV She denies CP with exertion + fam hx of premature heart disease (mother)  Echo reviewed from October 2018  LV EF: 55% -   60%   She is on currently aspirin 81 MG EC tablet  furosemide (LASIX) 20 MG tablet  amLODipine (NORVASC) 5 MG tablet  atorvastatin (LIPITOR) 40 MG tablet  nebivolol (BYSTOLIC) 10 MG tablet  hydrochlorothiazide (HYDRODIURIL) 25 MG tablet    2. Pre-diabetes Lab Results  Component Value Date   HGBA1C 6.1 (H) 10/01/2022   HGBA1C 5.1 01/17/2017    She is not currently on any blood glucose medications She endorses increased snacking and eating out the  last 4 months  Assessment/Plan:   1. Essential hypertension Continue  aspirin 81 MG EC tablet  furosemide (LASIX) 20 MG tablet  amLODipine (NORVASC) 5 MG tablet  atorvastatin (LIPITOR) 40 MG tablet  nebivolol (BYSTOLIC) 10 MG tablet  hydrochlorothiazide (HYDRODIURIL) 25 MG tablet   2. Pre-diabetes Limit sugar/simple CHO intake Increase daily activity  3. BMI 40.0-44.9, adult (HCC)-current 42.35  Andrea Thompson is currently in the action stage of change. As such, her goal is to continue with weight loss efforts. She has agreed to [employer]    Exercise goals: For substantial health benefits, adults should do at least 150 minutes (2 hours and 30 minutes) a week of moderate-intensity, or 75 minutes (1 hour and 15 minutes) a week of vigorous-intensity aerobic physical activity, or an equivalent combination of moderate- and vigorous-intensity aerobic activity. Aerobic activity should be performed in episodes of at least 10 minutes, and preferably, it should be spread throughout the week.  Behavioral modification strategies: increasing lean protein intake, decreasing simple carbohydrates, increasing vegetables, increasing water intake, no skipping meals, meal planning and cooking strategies, keeping healthy foods in the home, ways to avoid boredom eating, and planning for success.  Andrea Thompson has agreed to follow-up with our clinic in 4 weeks. She was informed of the importance of frequent follow-up visits to maximize her success with intensive lifestyle modifications for her multiple health conditions.   Objective:   Blood  pressure 124/75, pulse 61, temperature (!) 97.4 F (36.3 C), height 5\' 1"  (1.549 m), weight 224 lb (101.6 kg), SpO2 98%. Body mass index is 42.32 kg/m.  General: Cooperative, alert, well developed, in no acute distress. HEENT: Conjunctivae and lids unremarkable. Cardiovascular: Regular rhythm.  Lungs: Normal work of breathing. Neurologic: No focal deficits.    Lab Results  Component Value Date   CREATININE 0.75 10/01/2022   BUN 13 10/01/2022   NA 144 10/01/2022   K 4.4 10/01/2022   CL 103 10/01/2022   CO2 25 10/01/2022   Lab Results  Component Value Date   ALT 20 10/01/2022   AST 13 10/01/2022   ALKPHOS 110 10/01/2022   BILITOT 0.6 10/01/2022   Lab Results  Component Value Date   HGBA1C 6.1 (H) 10/01/2022   HGBA1C 5.1 01/17/2017   Lab Results  Component Value Date   INSULIN 17.2 10/01/2022   Lab Results  Component Value Date   TSH 2.15 07/22/2022   Lab Results  Component Value Date   CHOL 125 07/22/2022   HDL 40 (L) 07/22/2022   LDLCALC 67 07/22/2022   TRIG 95 07/22/2022   CHOLHDL 3.1 07/22/2022   Lab Results  Component Value Date   VD25OH 51.5 10/01/2022   VD25OH 33 01/26/2019   VD25OH 37 07/29/2017   Lab Results  Component Value Date   WBC 7.0 09/06/2022   HGB 12.6 09/06/2022   HCT 36.9 09/06/2022   MCV 83.7 09/06/2022   PLT 236 09/06/2022   No results found for: "IRON", "TIBC", "FERRITIN"  Attestation Statements:   Reviewed by clinician on day of visit: allergies, medications, problem list, medical history, surgical history, family history, social history, and previous encounter notes.  I have reviewed the above documentation for accuracy and completeness, and I agree with the above. -  Kaylee Trivett d. Normand Damron, NP-C

## 2023-04-30 ENCOUNTER — Encounter (INDEPENDENT_AMBULATORY_CARE_PROVIDER_SITE_OTHER): Payer: Self-pay | Admitting: Adult Health

## 2023-04-30 ENCOUNTER — Ambulatory Visit (INDEPENDENT_AMBULATORY_CARE_PROVIDER_SITE_OTHER): Payer: Self-pay | Admitting: Adult Health

## 2023-04-30 VITALS — BP 114/69 | HR 59 | Temp 98.2°F | Ht 61.0 in | Wt 223.0 lb

## 2023-04-30 DIAGNOSIS — R7303 Prediabetes: Secondary | ICD-10-CM

## 2023-04-30 DIAGNOSIS — I10 Essential (primary) hypertension: Secondary | ICD-10-CM

## 2023-04-30 DIAGNOSIS — E66813 Obesity, class 3: Secondary | ICD-10-CM

## 2023-04-30 DIAGNOSIS — Z6841 Body Mass Index (BMI) 40.0 and over, adult: Secondary | ICD-10-CM

## 2023-04-30 DIAGNOSIS — E559 Vitamin D deficiency, unspecified: Secondary | ICD-10-CM

## 2023-04-30 MED ORDER — ZEPBOUND 2.5 MG/0.5ML ~~LOC~~ SOAJ: 2.5000 mg | SUBCUTANEOUS | 0 refills | Status: AC

## 2023-04-30 NOTE — Progress Notes (Signed)
 WEIGHT SUMMARY AND BIOMETRICS  Vitals Temp: 98.2 F (36.8 C) BP: 114/69 Pulse Rate: (!) 59 SpO2: 95 %   Anthropometric Measurements Height: 5\' 1"  (1.549 m) Weight: 223 lb (101.2 kg) BMI (Calculated): 42.16 Weight at Last Visit: 224lb Weight Lost Since Last Visit: 1lb Weight Gained Since Last Visit: 0 Starting Weight: 251lb Total Weight Loss (lbs): 28 lb (12.7 kg)   Body Composition  Body Fat %: 49.9 % Fat Mass (lbs): 111.6 lbs Muscle Mass (lbs): 106.6 lbs Total Body Water (lbs): 87.4 lbs Visceral Fat Rating : 16   Other Clinical Data Fasting: no Labs: no Today's Visit #: 7 Starting Date: 09/14/22    Chief Complaint:   OBESITY Andrea Thompson is here to discuss her progress with her obesity treatment plan. She is on the the Category 2 Plan and states she is following her eating plan approximately 50 % of the time. She states she is exercising Walking 15-20 minutes 3 times per week.   Interim History:  Reviewed Bioempedence results wit pt: Muscle Mass: +2.8 lbs Adipose Mass: -3 lbs  Hydration-2 bottles of plain water, 20 oz unsweetened tea/day.  She is trying to increase plain water intake.  2025 Health Goals: 1) Loss to <200 lbs, current weight 223 lbs 2) Reduce medications- either decreased dose or d/c'd all together  Subjective:   1. Essential hypertension BP at goal at OV Home readings: SBP 110-120s DBP 60-70 She is on  aspirin  81 MG EC tablet  furosemide  (LASIX ) 20 MG tablet  amLODipine  (NORVASC ) 5 MG tablet  atorvastatin  (LIPITOR) 40 MG tablet  nebivolol  (BYSTOLIC ) 10 MG tablet  hydrochlorothiazide  (HYDRODIURIL ) 25 MG tablet   2. Pre-diabetes She denies family hx of MEN 2 or MTC She denies personal hx of pancreatitis. She has had hysterectomy   Dicussed risks/benefits of GLP-1 and GLP-1/GIP therapy  3. Vitamin D  deficiency She endorses stable energy levels   Assessment/Plan:   1. Essential hypertension (Primary) Continue aspirin   81 MG EC tablet  furosemide  (LASIX ) 20 MG tablet  amLODipine  (NORVASC ) 5 MG tablet  atorvastatin  (LIPITOR) 40 MG tablet  nebivolol  (BYSTOLIC ) 10 MG tablet  hydrochlorothiazide  (HYDRODIURIL ) 25 MG tablet   MONITOR for sx's of hypotension  2. Pre-diabetes Continue Cat 2 Meal Plan and regular walking  3. Vitamin D  deficiency Check Labs at next OV  4. Obesity, Class III, BMI 40-49.9 (morbid obesity) (HCC), Current BMI 42.16 Start tirzepatide  (ZEPBOUND ) 2.5 MG/0.5ML Pen Inject 2.5 mg into the skin once a week. Dispense: 3 mL, Refills: 0 ordered   Andrea Thompson is currently in the action stage of change. As such, her goal is to continue with weight loss efforts. She has agreed to the Category 2 Plan.   Exercise goals: For substantial health benefits, adults should do at least 150 minutes (2 hours and 30 minutes) a week of moderate-intensity, or 75 minutes (1 hour and 15 minutes) a week of vigorous-intensity aerobic physical activity, or an equivalent combination of moderate- and vigorous-intensity aerobic activity. Aerobic activity should be performed in episodes of at least 10 minutes, and preferably, it should be spread throughout the week.  Behavioral modification strategies: increasing lean protein intake, decreasing simple carbohydrates, increasing vegetables, increasing water intake, no skipping meals, meal planning and cooking strategies, keeping healthy foods in the home, and planning for success.  Andrea Thompson has agreed to follow-up with our clinic in 4 weeks. She was informed of the importance of frequent follow-up visits to maximize her success with intensive lifestyle modifications  for her multiple health conditions.   Check Fasting Labs at next OV  Objective:   Blood pressure 114/69, pulse (!) 59, temperature 98.2 F (36.8 C), height 5\' 1"  (1.549 m), weight 223 lb (101.2 kg), SpO2 95%. Body mass index is 42.14 kg/m.  General: Cooperative, alert, well developed, in no acute  distress. HEENT: Conjunctivae and lids unremarkable. Cardiovascular: Regular rhythm.  Lungs: Normal work of breathing. Neurologic: No focal deficits.   Lab Results  Component Value Date   CREATININE 0.75 10/01/2022   BUN 13 10/01/2022   NA 144 10/01/2022   K 4.4 10/01/2022   CL 103 10/01/2022   CO2 25 10/01/2022   Lab Results  Component Value Date   ALT 20 10/01/2022   AST 13 10/01/2022   ALKPHOS 110 10/01/2022   BILITOT 0.6 10/01/2022   Lab Results  Component Value Date   HGBA1C 6.1 (H) 10/01/2022   HGBA1C 5.1 01/17/2017   Lab Results  Component Value Date   INSULIN  17.2 10/01/2022   Lab Results  Component Value Date   TSH 2.15 07/22/2022   Lab Results  Component Value Date   CHOL 125 07/22/2022   HDL 40 (L) 07/22/2022   LDLCALC 67 07/22/2022   TRIG 95 07/22/2022   CHOLHDL 3.1 07/22/2022   Lab Results  Component Value Date   VD25OH 51.5 10/01/2022   VD25OH 33 01/26/2019   VD25OH 37 07/29/2017   Lab Results  Component Value Date   WBC 7.0 09/06/2022   HGB 12.6 09/06/2022   HCT 36.9 09/06/2022   MCV 83.7 09/06/2022   PLT 236 09/06/2022   No results found for: "IRON", "TIBC", "FERRITIN"  Attestation Statements:   Reviewed by clinician on day of visit: allergies, medications, problem list, medical history, surgical history, family history, social history, and previous encounter notes.  I have reviewed the above documentation for accuracy and completeness, and I agree with the above. -  Anju Sereno d. [place] Anagnos, NP-C

## 2023-05-01 ENCOUNTER — Telehealth (INDEPENDENT_AMBULATORY_CARE_PROVIDER_SITE_OTHER): Payer: Self-pay

## 2023-05-01 NOTE — Telephone Encounter (Signed)
PA for Zepbound 2.5 has been submitted, awaiting PA questions.

## 2023-05-01 NOTE — Telephone Encounter (Signed)
PA questions for Zepbound 2.5  have been answered and all documentation has been included. Waiting on a determination.

## 2023-05-05 NOTE — Telephone Encounter (Signed)
PA for Zepbound 2.5 has been denied. PA is now complete.     Outcome Denied on January 17 by [facility] [place] [facility] 2017 Denied. This health benefit plan does not cover the following services, supplies, drugs or charges: Any treatment or regimen, medical or surgical, for the purpose of reducing or controlling the weight of the member, or for the treatment of obesity, except for surgical treatment of morbid obesity, or as specifically covered by this health benefit plan.

## 2023-06-05 ENCOUNTER — Ambulatory Visit (INDEPENDENT_AMBULATORY_CARE_PROVIDER_SITE_OTHER): Payer: Self-pay | Admitting: Adult Health

## 2023-06-30 ENCOUNTER — Ambulatory Visit (INDEPENDENT_AMBULATORY_CARE_PROVIDER_SITE_OTHER): Payer: Self-pay | Admitting: Adult Health

## 2023-07-03 ENCOUNTER — Encounter (INDEPENDENT_AMBULATORY_CARE_PROVIDER_SITE_OTHER): Payer: Self-pay

## 2023-07-22 ENCOUNTER — Other Ambulatory Visit: Payer: BC Managed Care – PPO

## 2023-07-22 DIAGNOSIS — I10 Essential (primary) hypertension: Secondary | ICD-10-CM | POA: Diagnosis not present

## 2023-07-22 DIAGNOSIS — E786 Lipoprotein deficiency: Secondary | ICD-10-CM

## 2023-07-22 NOTE — Progress Notes (Signed)
 Annual Wellness Visit   Patient Care Team: Sylvan Evener, MD as PCP - General (Internal Medicine)  Visit Date: 07/24/23   Chief Complaint  Patient presents with   Annual Exam   Subjective:  Patient: Andrea Thompson, Female DOB: Sep 08, 1965, 58 y.o. MRN: [id number]  Vitals:   07/24/23 1056 07/24/23 1118  BP: (!) 142/82 (!) 130/90  Scarring lower left leg from mosquito bites  Andrea Thompson is a 58 y.o. Female who presents today for her Annual Wellness Visit. Patient has history of has Anxiety/Depression; Situational Stress; Essential Hypertension; IBS (Irritable Bowel Syndrome); Bilateral Lower Extremity Edema; Hypokalemia; Allergic Rhinitis; Obesity, Class III, BMI 40-49.9 (Morbid Obesity); E-Coli UTI; SOBOE (Shortness Of Breath On Exertion); Other Fatigue; Gastroesophageal Reflux Disease; OSA On CPAP; and Insulin  Resistance.  She says that she has been having some mild urinary incontinence and would like to see Urology for this, but not in the immediate future.  History of Hypertension; Edema treated with Amlodipine  5 mg and HCTZ 25 mg daily or Furosemide  20 mg. Blood Pressure: hypertensive today at 142/82, 130/90 on recheck 22 minutes later. History of Hypokalemia treated with Klor-con  40 meq daily. History of Ventricular Bigeminy; Palpitations treated with Bystolic  10 mg daily. Ventricular Bigeminy was diagnosed February 2012 during an evaluation in the emergency department for chest pain and palpitations. She had a normal stress echo.   History of Hyperlipidemia treated with Atorvastatin  40 mg daily. 07/22/2023 Lipid Panel, compared to 07/2022: HDL 43, elevated from 40; otherwise WNL.  History of Impaired Glucose Tolerance treated with 07/22/2023 HgbA1c, compared to 2018: 6.1, elevated from 5.1.   History of Anxiety/Depression; Situational Stress treated with Zoloft  100 mg daily. She says that her situational stress is currently contributed by her mother, who is currently living in a  facility with memory loss and tends to call her daughter multiple times a day and becomes otherwise distressed if Andrea Thompson does not answer.  History of Obesity; BMI 44.93 with weight 237 pounds previously treated with Zepbound  through [facility], however she has decided not to continue going. Discussed surgical intervention, however she is not interested at the moment, but will think about it.   History of Obstructive Sleep Apnea treated with CPAP device. At-home sleep study done through [facility].   History of GERD treated with Pantoprazole  40 mg daily.   Labs 07/22/2023 CBC: WNL CMP, compared to 07/2022: ALT 42, elevated from 26; otherwise WNL.   TSH, compared to 07/2022: 3.51, elevated from 2.15.  S/p Abdominal Hysterectomy w/o Oophorectomy in 2012. PAP Smear last completed 01/26/2019  normal. Says she has Sonogram and repeat pap smear scheduled with Matt Song for 09/18/2023.   Unilateral Right Mammogram 09/19/2022 with indeterminate 8 mm group of calcifications, found to be fibroadenoma w/ associated calcification with repeat recommendation of 2025.  Colonoscopy 09/2018 by Dr. Andra Banco in [facility] with 1 Polyp removed from the transverse colon found to be a lymphoid aggregate per pathology with recommended repeat 2030. She also reported completion in 2022.   Bone Density due in 2032.   Vaccine Counseling: Due for Shingles 1/2; UTD on Flu and Tdap.  Past Medical History:  Diagnosis Date   Anxiety    B12 deficiency    Depression    GERD (gastroesophageal reflux disease)    High blood pressure    Joint pain    Lactose intolerance    Migraine headache    Multiple food allergies    Pre-diabetes  Swelling of both lower extremities    Vitamin D  deficiency    Vitamin D  deficiency   Medical/Surgical History Narrative:  Allergic/Intolerant to: Penicillins - hives; Imitrex - nausea/vomiting; Cinnamon - itching, swelling, & rash.  2020 - Appendectomy in  February at [facility].   2012 - Abdominal Hysterectomy w/o Oophorectomy   2008 - Quit smoking Family History  Problem Relation Age of Onset   Depression Mother    Stroke Mother    Hypertension Mother    Diabetes Mother    Heart disease Mother        started at age 34, recently had her 5th MI as of 2018   High Cholesterol Mother    Sleep apnea Mother    Obesity Mother    Hypertension Father    High Cholesterol Father    Heart disease Father    Cancer Father     Social History   Social History Narrative   Married - husband works for [employer]. Has a GED degree. 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  Formerly smoked a pack of cigarettes daily for some 19 years but stopped in 2008.  She has 1 son and 2 daughters.   2025 - Visits her mother, who has been in a nursing facility for 8 years now, every weekend for 1 hour and answers her multiple calls so as to alleviate her mother's concern.        Fhx:   Grandfather w/ hx of CABG.   Father deceased of Lung Cancer in 11/25/2002.   Mother w/ hx of Coronary Artery Disease, Angioplasty at 58 y.o, CABG at 58 y.o, MI at 58 y.o, Stroke, and Memory Loss. She formerly lived with patient, but 8 years ago moved into a nursing facility.   1 Brother in good health.      Review of Systems  Constitutional:  Negative for chills, fever, malaise/fatigue and weight loss.  HENT:  Negative for hearing loss, sinus pain and sore throat.   Respiratory:  Negative for cough, hemoptysis and shortness of breath.   Cardiovascular:  Negative for chest pain, palpitations, leg swelling and PND.  Gastrointestinal:  Negative for abdominal pain, constipation, diarrhea, heartburn, nausea and vomiting.  Genitourinary:  Negative for dysuria, frequency and urgency.       (+) Urinary Incontinence  Musculoskeletal:  Negative for back pain, myalgias and neck pain.  Skin:  Negative for itching and rash.  Neurological:   Negative for dizziness, tingling, seizures and headaches.  Endo/Heme/Allergies:  Negative for polydipsia.  Psychiatric/Behavioral:  Negative for depression. The patient is not nervous/anxious.        (+) Situational Stress    Objective:  Vitals: BP (!) 130/90 (BP Location: Left Arm, Patient Position: Sitting, Cuff Size: Normal)   Pulse (!) 56   Temp 98.4 F (36.9 C) (Temporal)   Ht 5\' 1"  (1.549 m)   Wt 237 lb 12.8 oz (107.9 kg)   SpO2 94%   BMI 44.93 kg/m  Physical Exam Vitals and nursing note reviewed.  Constitutional:      General: She is not in acute distress.    Appearance: Normal appearance. She is not ill-appearing or toxic-appearing.  HENT:     Head: Normocephalic and atraumatic.     Right Ear: Hearing, tympanic membrane, ear canal and external ear normal.     Left Ear: Hearing, tympanic membrane, ear canal and external ear normal.     Mouth/Throat:  Pharynx: Oropharynx is clear.  Eyes:     Extraocular Movements: Extraocular movements intact.     Pupils: Pupils are equal, round, and reactive to light.  Neck:     Thyroid: No thyroid mass, thyromegaly or thyroid tenderness.     Vascular: No carotid bruit.  Cardiovascular:     Rate and Rhythm: Normal rate and regular rhythm. No extrasystoles are present.    Pulses:          Dorsalis pedis pulses are 2+ on the right side and 2+ on the left side.     Heart sounds: Normal heart sounds. No murmur heard.    No friction rub. No gallop.  Pulmonary:     Effort: Pulmonary effort is normal.     Breath sounds: Normal breath sounds. No decreased breath sounds, wheezing, rhonchi or rales.  Chest:     Chest wall: No mass.  Abdominal:     Palpations: Abdomen is soft. There is no hepatomegaly, splenomegaly or mass.     Tenderness: There is no abdominal tenderness.     Hernia: No hernia is present.  Musculoskeletal:     Cervical back: Normal range of motion.     Right lower leg: No edema.     Left lower leg: No edema.   Lymphadenopathy:     Cervical: No cervical adenopathy.     Upper Body:     Right upper body: No supraclavicular adenopathy.     Left upper body: No supraclavicular adenopathy.  Skin:    General: Skin is warm and dry.  Neurological:     General: No focal deficit present.     Mental Status: She is alert and oriented to person, place, and time. Mental status is at baseline.     Sensory: Sensation is intact.     Motor: Motor function is intact. No weakness.     Deep Tendon Reflexes: Reflexes are normal and symmetric.  Psychiatric:        Attention and Perception: Attention normal.        Mood and Affect: Mood normal.        Speech: Speech normal.        Behavior: Behavior normal.        Thought Content: Thought content normal.        Cognition and Memory: Cognition normal.        Judgment: Judgment normal.   Most Recent Fall Risk Assessment:    07/24/2023   10:57 AM  Fall Risk   Falls in the past year? 1  Number falls in past yr: 1  Injury with Fall? 0  Risk for fall due to : Other (Comment)  Risk for fall due to: Comment PAtient states she thinks she may have tore her rotator cuff. Tripped over uneven pavement.  Follow up Falls evaluation completed   Most Recent Depression Screenings:    07/24/2023   11:34 AM 07/24/2023   11:08 AM  PHQ 2/9 Scores  PHQ - 2 Score 4 2  PHQ- 9 Score 12    Results:  Studies Obtained And Personally Reviewed By Me:  S/p Abdominal Hysterectomy w/o Oophorectomy in 2012. PAP Smear last completed 01/26/2019  normal.    Unilateral Right Mammogram 09/19/2022 with indeterminate 8 mm group of calcifications, found to be fibroadenoma w/ associated calcification.  Colonoscopy 09/2018 by Dr. Andra Banco in [facility] with 1 Polyp removed from the transverse colon found to be a lymphoid aggregate per pathology.  Labs:  Component Value Date/Time   NA 144 07/22/2023 1053   NA 144 10/01/2022 1046   K 4.6 07/22/2023 1053   CL 106 07/22/2023 1053   CO2 31  07/22/2023 1053   GLUCOSE 99 07/22/2023 1053   BUN 14 07/22/2023 1053   BUN 13 10/01/2022 1046   CREATININE 0.68 07/22/2023 1053   CALCIUM  9.8 07/22/2023 1053   PROT 7.3 07/22/2023 1053   PROT 7.0 10/01/2022 1046   ALBUMIN 4.6 10/01/2022 1046   AST 16 07/22/2023 1053   ALT 42 (H) 07/22/2023 1053   ALKPHOS 110 10/01/2022 1046   BILITOT 0.7 07/22/2023 1053   BILITOT 0.6 10/01/2022 1046   GFRNONAA >60 09/06/2022 1204   GFRNONAA 99 01/25/2020 0931   GFRAA 115 01/25/2020 0931    Lab Results  Component Value Date   WBC 6.4 07/22/2023   HGB 12.9 07/22/2023   HCT 38.1 07/22/2023   MCV 87.4 07/22/2023   PLT 253 07/22/2023   Lab Results  Component Value Date   CHOL 105 07/22/2023   HDL 43 (L) 07/22/2023   LDLCALC 45 07/22/2023   TRIG 88 07/22/2023   CHOLHDL 2.4 07/22/2023   Lab Results  Component Value Date   HGBA1C 6.1 (H) 10/01/2022    Lab Results  Component Value Date   TSH 3.51 07/22/2023    Assessment & Plan:   Orders Placed This Encounter  Procedures   POCT URINALYSIS DIP (CLINITEK)  Other Labs Reviewed today: CBC: WNL CMP, compared to 07/2022: ALT 42, elevated from 26; otherwise WNL.   TSH, compared to 07/2022: 3.51, elevated from 2.15.  mild Urinary Incontinence: she would like to see Urology for this, but not in the immediate future.  Hypertension; Edema treated with Amlodipine  5 mg , Bystolic  10 mg daily,and HCTZ 25 mg daily or Furosemide  20 mg. Blood Pressure: hypertensive today at 142/82, 130/90 on recheck 22 minutes later.   Hypokalemia treated with Klor-con  40 meq daily.   Ventricular Bigeminy; Palpitations treated with Bystolic .  Hyperlipidemia treated with Atorvastatin  40 mg daily. 07/22/2023 Lipid Panel, compared to 07/2022: HDL 43, elevated from 40; otherwise lipids are normal.  Impaired Glucose Tolerance has been treated with Zepbound .  07/22/2023 HgbA1c 6.1%, elevated from previous values.Has stopped going to [facility] where [facility]   was prescribed.Wants to try diet and exercise on her own. Suggest follow up in 6 months. Offered Endocrinology referral but patient declined.  Anxiety/Depression; Situational Stress treated with Zoloft  100 mg daily. Situational stress contributed by her mother, who is currently living in a facility with memory loss and tends to call her daughter multiple times a day and becomes otherwise distressed if Eufelia does not answer.  Obesity; BMI 44.93 with weight 237 pounds previously treated with Zepbound  through [facility], however she has decided not to continue going. Discussed surgical intervention, however she is not interested at the moment, but will think about it.   Obstructive Sleep Apnea treated with CPAP device. At-home sleep study done through [facility].   GERD treated with Pantoprazole  40 mg daily.   S/p Abdominal Hysterectomy w/o Oophorectomy in 2012. PAP Smear last completed 01/26/2019  normal. Says she has Sonogram and repeat pap smear scheduled with Matt Song for 09/18/2023.   Unilateral Right Mammogram 09/19/2022 with indeterminate 8 mm group of calcifications, found to be fibroadenoma w/ associated calcifications with repeat recommendation of 2025.  Colonoscopy 09/2018 by Dr. Andra Banco in [facility] with 1 Polyp removed from the transverse colon found  to be a lymphoid aggregate per pathology with recommended repeat 2030. She also reported completion in 2022.   Bone Density due in 2032.   Vaccine Counseling: Due for Shingles 1/2; UTD on Flu and Tdap.    Annual wellness visit done today including the all of the following: Reviewed patient's Family Medical History Reviewed and updated list of patient's medical providers Assessment of cognitive impairment was done Assessed patient's functional ability Established a written schedule for health screening services Health Risk Assessent Completed and Reviewed  Discussed health benefits of physical activity,  and encouraged her to engage in regular exercise appropriate for her age and condition.    I,Emily Lagle,acting as a Neurosurgeon for Sylvan Evener, MD.,have documented all relevant documentation on the behalf of Sylvan Evener, MD,as directed by  Sylvan Evener, MD while in the presence of Sylvan Evener, MD.   I, Sylvan Evener, MD, have reviewed all documentation for this visit. The documentation on 08/03/23 for the exam, diagnosis, procedures, and orders are all accurate and complete.

## 2023-07-23 LAB — LIPID PANEL
Cholesterol: 105 mg/dL (ref <200)
HDL: 43 mg/dL — ABNORMAL LOW (ref >=50)
LDL Cholesterol (Calc): 45 mg/dL
Non-HDL Cholesterol (Calc): 62 mg/dL (ref <130)
Total CHOL/HDL Ratio: 2.4 (calc) (ref <5.0)
Triglycerides: 88 mg/dL (ref <150)

## 2023-07-23 LAB — COMPLETE METABOLIC PANEL WITHOUT GFR
AG Ratio: 1.7 (calc) (ref 1.0–2.5)
ALT: 42 U/L — ABNORMAL HIGH (ref 6–29)
AST: 16 U/L (ref 10–35)
Albumin: 4.6 g/dL (ref 3.6–5.1)
Alkaline phosphatase (APISO): 94 U/L (ref 37–153)
BUN: 14 mg/dL (ref 7–25)
CO2: 31 mmol/L (ref 20–32)
Calcium: 9.8 mg/dL (ref 8.6–10.4)
Chloride: 106 mmol/L (ref 98–110)
Creat: 0.68 mg/dL (ref 0.50–1.03)
Globulin: 2.7 g/dL (ref 1.9–3.7)
Glucose, Bld: 99 mg/dL (ref 65–99)
Potassium: 4.6 mmol/L (ref 3.5–5.3)
Sodium: 144 mmol/L (ref 135–146)
Total Bilirubin: 0.7 mg/dL (ref 0.2–1.2)
Total Protein: 7.3 g/dL (ref 6.1–8.1)

## 2023-07-23 LAB — CBC WITH DIFFERENTIAL/PLATELET
Absolute Lymphocytes: 2093 {cells}/uL (ref 850–3900)
Absolute Monocytes: 339 {cells}/uL (ref 200–950)
Basophils Absolute: 58 {cells}/uL (ref 0–200)
Basophils Relative: 0.9 %
Eosinophils Absolute: 122 {cells}/uL (ref 15–500)
Eosinophils Relative: 1.9 %
HCT: 38.1 % (ref 35.0–45.0)
Hemoglobin: 12.9 g/dL (ref 11.7–15.5)
MCH: 29.6 pg (ref 27.0–33.0)
MCHC: 33.9 g/dL (ref 32.0–36.0)
MCV: 87.4 fL (ref 80.0–100.0)
MPV: 10.1 fL (ref 7.5–12.5)
Monocytes Relative: 5.3 %
Neutro Abs: 3789 {cells}/uL (ref 1500–7800)
Neutrophils Relative %: 59.2 %
Platelets: 253 10*3/uL (ref 140–400)
RBC: 4.36 10*6/uL (ref 3.80–5.10)
RDW: 13.1 % (ref 11.0–15.0)
Total Lymphocyte: 32.7 %
WBC: 6.4 10*3/uL (ref 3.8–10.8)

## 2023-07-23 LAB — TSH: TSH: 3.51 m[IU]/L (ref 0.40–4.50)

## 2023-07-24 ENCOUNTER — Encounter: Payer: Self-pay | Admitting: Internal Medicine

## 2023-07-24 ENCOUNTER — Ambulatory Visit: Payer: BC Managed Care – PPO | Admitting: Internal Medicine

## 2023-07-24 VITALS — BP 130/90 | HR 56 | Temp 98.4°F | Ht 61.0 in | Wt 237.8 lb

## 2023-07-24 DIAGNOSIS — F4541 Pain disorder exclusively related to psychological factors: Secondary | ICD-10-CM

## 2023-07-24 DIAGNOSIS — F32A Depression, unspecified: Secondary | ICD-10-CM

## 2023-07-24 DIAGNOSIS — K219 Gastro-esophageal reflux disease without esophagitis: Secondary | ICD-10-CM

## 2023-07-24 DIAGNOSIS — F419 Anxiety disorder, unspecified: Secondary | ICD-10-CM

## 2023-07-24 DIAGNOSIS — R609 Edema, unspecified: Secondary | ICD-10-CM | POA: Diagnosis not present

## 2023-07-24 DIAGNOSIS — Z0000 Encounter for general adult medical examination without abnormal findings: Secondary | ICD-10-CM

## 2023-07-24 DIAGNOSIS — R921 Mammographic calcification found on diagnostic imaging of breast: Secondary | ICD-10-CM

## 2023-07-24 DIAGNOSIS — E785 Hyperlipidemia, unspecified: Secondary | ICD-10-CM

## 2023-07-24 DIAGNOSIS — I10 Essential (primary) hypertension: Secondary | ICD-10-CM | POA: Diagnosis not present

## 2023-07-24 DIAGNOSIS — R600 Localized edema: Secondary | ICD-10-CM

## 2023-07-24 DIAGNOSIS — E7849 Other hyperlipidemia: Secondary | ICD-10-CM

## 2023-07-24 DIAGNOSIS — G4733 Obstructive sleep apnea (adult) (pediatric): Secondary | ICD-10-CM

## 2023-07-24 DIAGNOSIS — Z6841 Body Mass Index (BMI) 40.0 and over, adult: Secondary | ICD-10-CM

## 2023-07-24 DIAGNOSIS — E669 Obesity, unspecified: Secondary | ICD-10-CM

## 2023-07-24 DIAGNOSIS — Z8249 Family history of ischemic heart disease and other diseases of the circulatory system: Secondary | ICD-10-CM

## 2023-07-24 LAB — POCT URINALYSIS DIP (CLINITEK)
Bilirubin, UA: NEGATIVE
Blood, UA: NEGATIVE
Glucose, UA: NEGATIVE mg/dL
Ketones, POC UA: NEGATIVE mg/dL
Leukocytes, UA: NEGATIVE
Nitrite, UA: NEGATIVE
POC PROTEIN,UA: NEGATIVE
Spec Grav, UA: 1.01 (ref 1.010–1.025)
Urobilinogen, UA: 0.2 U/dL
pH, UA: 6 (ref 5.0–8.0)

## 2023-08-03 ENCOUNTER — Encounter: Payer: Self-pay | Admitting: Internal Medicine

## 2023-08-03 NOTE — Patient Instructions (Signed)
 Continue CPAP. Continue to work on diet and exercise regimen. We can refer you to Endocrine for Zepbound  if you desire since you are no longer going to [facility]. Continue Pantoprazole  for GERD.

## 2023-08-07 ENCOUNTER — Other Ambulatory Visit: Payer: Self-pay | Admitting: Internal Medicine

## 2023-08-09 ENCOUNTER — Other Ambulatory Visit: Payer: Self-pay | Admitting: Internal Medicine

## 2023-09-18 DIAGNOSIS — Z1231 Encounter for screening mammogram for malignant neoplasm of breast: Secondary | ICD-10-CM | POA: Diagnosis not present

## 2023-09-18 DIAGNOSIS — Z01419 Encounter for gynecological examination (general) (routine) without abnormal findings: Secondary | ICD-10-CM | POA: Diagnosis not present

## 2023-09-23 ENCOUNTER — Encounter: Payer: Self-pay | Admitting: Internal Medicine

## 2023-11-01 ENCOUNTER — Other Ambulatory Visit: Payer: Self-pay | Admitting: Internal Medicine

## 2024-01-06 DIAGNOSIS — N3942 Incontinence without sensory awareness: Secondary | ICD-10-CM | POA: Diagnosis not present

## 2024-01-06 DIAGNOSIS — N393 Stress incontinence (female) (male): Secondary | ICD-10-CM | POA: Diagnosis not present

## 2024-01-06 DIAGNOSIS — N8110 Cystocele, unspecified: Secondary | ICD-10-CM | POA: Diagnosis not present

## 2024-01-06 DIAGNOSIS — M6281 Muscle weakness (generalized): Secondary | ICD-10-CM | POA: Diagnosis not present

## 2024-01-22 ENCOUNTER — Other Ambulatory Visit: Payer: Self-pay | Admitting: Internal Medicine

## 2024-01-28 ENCOUNTER — Other Ambulatory Visit: Payer: Self-pay | Admitting: Internal Medicine

## 2024-02-15 DIAGNOSIS — H00011 Hordeolum externum right upper eyelid: Secondary | ICD-10-CM | POA: Diagnosis not present

## 2024-02-15 DIAGNOSIS — I10 Essential (primary) hypertension: Secondary | ICD-10-CM | POA: Diagnosis not present

## 2024-02-16 ENCOUNTER — Ambulatory Visit: Payer: Self-pay

## 2024-02-16 DIAGNOSIS — R609 Edema, unspecified: Secondary | ICD-10-CM | POA: Diagnosis not present

## 2024-02-16 DIAGNOSIS — I10 Essential (primary) hypertension: Secondary | ICD-10-CM | POA: Diagnosis not present

## 2024-02-16 DIAGNOSIS — R9431 Abnormal electrocardiogram [ECG] [EKG]: Secondary | ICD-10-CM | POA: Diagnosis not present

## 2024-02-16 DIAGNOSIS — Z7982 Long term (current) use of aspirin: Secondary | ICD-10-CM | POA: Diagnosis not present

## 2024-02-16 DIAGNOSIS — E785 Hyperlipidemia, unspecified: Secondary | ICD-10-CM | POA: Diagnosis not present

## 2024-02-16 DIAGNOSIS — Z8249 Family history of ischemic heart disease and other diseases of the circulatory system: Secondary | ICD-10-CM | POA: Diagnosis not present

## 2024-02-16 DIAGNOSIS — Z87891 Personal history of nicotine dependence: Secondary | ICD-10-CM | POA: Diagnosis not present

## 2024-02-16 DIAGNOSIS — M7989 Other specified soft tissue disorders: Secondary | ICD-10-CM | POA: Diagnosis not present

## 2024-02-16 DIAGNOSIS — R5383 Other fatigue: Secondary | ICD-10-CM | POA: Diagnosis not present

## 2024-02-16 DIAGNOSIS — Z79899 Other long term (current) drug therapy: Secondary | ICD-10-CM | POA: Diagnosis not present

## 2024-02-16 DIAGNOSIS — R110 Nausea: Secondary | ICD-10-CM | POA: Diagnosis not present

## 2024-02-16 DIAGNOSIS — M79602 Pain in left arm: Secondary | ICD-10-CM | POA: Diagnosis not present

## 2024-02-16 DIAGNOSIS — F410 Panic disorder [episodic paroxysmal anxiety] without agoraphobia: Secondary | ICD-10-CM | POA: Diagnosis not present

## 2024-02-16 NOTE — Telephone Encounter (Signed)
 This RN made third attempt to contact patient with no answer. A voicemail was left with call back number provided.

## 2024-02-16 NOTE — Telephone Encounter (Signed)
 This RN made second attempt to contact patient with no answer. A voicemail was left with call back number provided.

## 2024-02-16 NOTE — Telephone Encounter (Signed)
 Pt disconnected call during transfer. No answer. This RN returned call, no asnwer, LVM. Will place in CB folder for f/u   Copied from CRM #[id number]. Topic: Clinical - Red Word Triage >> Feb 16, 2024 11:11 AM Pinkey ORN wrote: Red Word that prompted transfer to Nurse Triage: High Blood Pressure >> Feb 16, 2024 11:16 AM Pinkey ORN wrote: Patient states she had to hang up so that EMS could complete their assessment, therefor she's expecting a call back to be scheduled.  >> Feb 16, 2024 11:15 AM Pinkey ORN wrote: Patient mentioned she's been experiencing hot sensations running down her body, as well as her blood pressure reading 186/78. Patient states she's called the ambulance for herself and they're currently there running an assessment on her. Patient states when she went to the urgent care on yesterday, they mentioned she was close to having a possible stroke. Patient states she's wanting to get scheduled in to come into the office on tomorrow.

## 2024-02-17 DIAGNOSIS — R079 Chest pain, unspecified: Secondary | ICD-10-CM | POA: Diagnosis not present

## 2024-02-17 DIAGNOSIS — R0789 Other chest pain: Secondary | ICD-10-CM | POA: Diagnosis not present

## 2024-02-17 DIAGNOSIS — R110 Nausea: Secondary | ICD-10-CM | POA: Diagnosis not present

## 2024-02-17 DIAGNOSIS — M79602 Pain in left arm: Secondary | ICD-10-CM | POA: Diagnosis not present

## 2024-02-17 NOTE — ED Provider Notes (Signed)
 ------------------------------------------------------------------------------- Attestation signed by Dorn Arloa Hoover, MD at 02/26/2024  9:23 AM Patient was seen after full presentation from [facility], [facility].  Please see my independent note documenting my face-to-face encounter with history and physical exam.  My independent note should serve as my attestation.  Patient's presentation is most consistent with acute presentation with potential threat to life or bodily function. SABRA  -------------------------------------------------------------------------------  ED OBSERVATION - Chest pain.   Shared visit with ED attending, Dr. Hoover.  CHEST PAIN  Subjective: Andrea Thompson 58 y.o. yo female with PMH sig for Depression, Gerd, Palpitations, IBS, HTN, HLD comes to the ED with complaints of fatigue, shortness of breath, nausea, diarrhea, and a pain down her left arm. Was seen at urgent care two days ago for same and was found to have elevated blood pressure. No medical complaints at this time.   Medical History[1] Family History[2] Social History[3]  Meds, and ALL reviewed. Epic notes reviewed.  Review of Systems  Review of Systems - History obtained from chart review and the patient Pertinent information noted in HPI  Physical Examination Vitals: BP 150/66 (BP Location: Right arm, Patient Position: Lying)   Pulse 66   Temp 98.2 F (36.8 C) (Oral)   Resp 18   Ht 154.9 cm (5' 1)   Wt 106 kg (233 lb 8 oz)   SpO2 97%   BMI 44.12 kg/m   Physical Exam   Medical Decision Making  Patient denies any symptoms overnight. Reports she is feeling much better. Awaiting NM stress testing today.   NM results:  IMPRESSION: 1.) LIMITATIONS: None. 2.) MYOCARDIAL PERFUSION: Medium size, mild intensity mid to apical anteroseptal ischemia, likely LAD territory. 3.) LEFT VENTRICULAR EJECTION FRACTION: Normal. 4.) REGIONAL WALL MOTION: Normal.  Patient evaluated by Cardiology- Dr.  Launie. Please see consultant's note for their evaluation and recommendations. Per Dr. Launie, this is not felt to be ACS. Patient is asymptomatic and symptoms appear more GI related. Strict return precautions discussed. Discussed findings, test results, plan of care, and proper follow up with patient. Patient is in agreement to plan and all questions were answered to the patient's satisfaction. Patient will be discharged with follow up to their PCP or return to the ED if symptoms persist, worsen or change.   The patient is currently in observation status in order to determine necessity of potential hospital admission. The patient presentation is consistent with acute presentation with potential threat to life or bodily function.   Provider time spent in patient care today, inclusive of but not limited to clinical reassessment, review of diagnostic studies and discharge preparation, was greater than 30 minutes.   Clinical Impression 1. Left arm pain   2. Nausea      If discharged home: After Visit Summary given to the patient at the time of discharge Discussed diagnosis, treatment plan and indications in which she should return to the ED. Patient felt stable for discharge home  New Prescriptions   No medications on file    Outpatient Follow-up Recommendations: [facility] -  EMERGENCY DEPARTMENT [address] [place]  [address] [phone]  As needed  Ronal Norleen Hailstone, MD [address] [place] [address] [phone]  Schedule an appointment as soon as possible for a visit in 1 week      Chart completed using Dragon Voice Dictation       [1] Past Medical History: Diagnosis Date  . Delayed emergence from  anesthesia   . Depression   . GERD (gastroesophageal reflux disease)   . History of palpitations   . History of panic attacks    mild one a week ago  . Hypertension   . IBS (irritable  bowel syndrome)    diarrhea  . Left leg swelling    chronic since childbirth  . Migraines    last was a year ago  [2] Family History Problem Relation Name Age of Onset  . Heart disease Mother    . Heart attack Mother    . Stroke Mother         from angioplasty  . Diabetes Mother    . Coronary artery disease Mother    . Coronary artery disease Father    . Heart attack Father    . Heart disease Father    . Lung cancer Father    . Abnormal EKG Father    [3] Social History Socioeconomic History  . Marital status: Married  Tobacco Use  . Smoking status: Former    Current packs/day: 0.00    Types: Cigarettes    Quit date: 09/24/2007    Years since quitting: 16.4  . Smokeless tobacco: Never  Substance and Sexual Activity  . Alcohol use: Never  . Drug use: Never

## 2024-02-19 ENCOUNTER — Encounter: Payer: Self-pay | Admitting: Internal Medicine

## 2024-02-19 ENCOUNTER — Ambulatory Visit: Payer: Self-pay | Admitting: Internal Medicine

## 2024-02-19 VITALS — BP 130/80 | HR 58 | Ht 61.0 in

## 2024-02-19 DIAGNOSIS — F439 Reaction to severe stress, unspecified: Secondary | ICD-10-CM | POA: Diagnosis not present

## 2024-02-19 DIAGNOSIS — F32A Depression, unspecified: Secondary | ICD-10-CM

## 2024-02-19 DIAGNOSIS — R072 Precordial pain: Secondary | ICD-10-CM

## 2024-02-19 DIAGNOSIS — I10 Essential (primary) hypertension: Secondary | ICD-10-CM

## 2024-02-19 DIAGNOSIS — E785 Hyperlipidemia, unspecified: Secondary | ICD-10-CM

## 2024-02-19 DIAGNOSIS — Z6841 Body Mass Index (BMI) 40.0 and over, adult: Secondary | ICD-10-CM

## 2024-02-19 DIAGNOSIS — R600 Localized edema: Secondary | ICD-10-CM

## 2024-02-19 DIAGNOSIS — G4733 Obstructive sleep apnea (adult) (pediatric): Secondary | ICD-10-CM

## 2024-02-19 DIAGNOSIS — F418 Other specified anxiety disorders: Secondary | ICD-10-CM

## 2024-02-19 DIAGNOSIS — Z8639 Personal history of other endocrine, nutritional and metabolic disease: Secondary | ICD-10-CM | POA: Diagnosis not present

## 2024-02-19 DIAGNOSIS — H00013 Hordeolum externum right eye, unspecified eyelid: Secondary | ICD-10-CM | POA: Diagnosis not present

## 2024-02-19 DIAGNOSIS — E7439 Other disorders of intestinal carbohydrate absorption: Secondary | ICD-10-CM

## 2024-02-19 DIAGNOSIS — K219 Gastro-esophageal reflux disease without esophagitis: Secondary | ICD-10-CM

## 2024-02-19 NOTE — Progress Notes (Signed)
 Patient Care Team: Perri Ronal PARAS, MD as PCP - General (Internal Medicine)  Visit Date: 02/19/24  Subjective:    Patient ID: Andrea Thompson , Female   DOB: 11/06/1965, 58 y.o.    MRN: [id number]   58 y.o. Female presents today for Hospital follow up. Patient has a past medical history of Hypertension, OSA, Allergic rhinitis.  On 02/15/2024 she went to the urgent care for hordeolum. She had mild swelling In her right eye that progressed to blurred vision and eye pain. She was prescribed Erythromycin ointment. Her Blood pressure was 151/80 and 174/70 on recheck. She was advised to follow up with her primary care physician and go to the ER if she was having chest pain.   On 02/16/2024 she was watching TV she felt the sudden onset of left arm pain and nausea. She presented to the ED with complaints of fatigue, Shortness of breath, Nausea, diarrhea and a pain down her left arm. CBC unremarkable for leukocytosis, leukopenia, anemia, thrombocytopenia. CMP unremarkable for electrolyte abnormality, AKI, liver injury. She was given Zofran  4 mg injection, Nitroglycerin  0.4 mg tablet and Aspirin  243 mg. Cardiac stress test  revealed Medium size, mild intensity reversible changes are seen mid to apical anterior septal wall. Likely artifactual changes in the inferior and lateral walls with improved perfusion during stress.Was admitted to [facility] in [facility]. She felt stable and  a day later and was discharged.     History of Hypertension; Edema treated with Amlodipine  5 mg and HCTZ 25 mg daily or Furosemide  20 mg. Blood Pressure today is normal at 130/80. History of Hypokalemia treated with Klor-con  40 meq daily. History of Ventricular Bigeminy; Palpitations treated with Bystolic  10 mg daily. Ventricular Bigeminy was diagnosed February 2012 during an evaluation in the emergency department for chest pain and palpitations.  Had abnormal perfusion study while hospitalized thought to perhaps due to breast  attenuation. Out patient follow up recommended. Will make referral to Dr. Mona whom she has seen before.  History of Hyperlipidemia treated with Atorvastatin  40 mg daily.    History of Impaired glucose tolerance.   History of Obesity.   History of Anxiety/Depression; Situational stress treated with Zoloft  100 mg daily.   History of GE Reflux treated with Pantoprazole  40 mg.   History of Obstructive Sleep Apnea treated with CPAP device.  Past Medical History:  Diagnosis Date   Anxiety    B12 deficiency    Depression    GERD (gastroesophageal reflux disease)    High blood pressure    Joint pain    Lactose intolerance    Migraine headache    Multiple food allergies    Pre-diabetes    Swelling of both lower extremities    Vitamin D  deficiency    Vitamin D  deficiency      Family History  Problem Relation Age of Onset   Depression Mother    Stroke Mother    Hypertension Mother    Diabetes Mother    Heart disease Mother        started at age 28, recently had her 5th MI as of 2018   High Cholesterol Mother    Sleep apnea Mother    Obesity Mother    Hypertension Father    High Cholesterol Father    Heart disease Father    Cancer Father     Social History   Social History Narrative   Married - husband works for [employer]. Has a GED degree.  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  Formerly smoked a pack of cigarettes daily for some 19 years but stopped in 2008.  She has 1 son and 2 daughters.   2025 - Visits her mother, who has been in a nursing facility for 8 years now, every weekend for 1 hour and answers her multiple calls so as to alleviate her mother's concern.        Fhx:   Grandfather w/ hx of CABG.   Father deceased of Lung Cancer in 2002-11-02.   Mother w/ hx of Coronary Artery Disease, Angioplasty at 58 y.o, CABG at 58 y.o, MI at 58 y.o, Stroke, and Memory Loss. She formerly lived with patient, but 8 years ago moved into a  nursing facility.   1 Brother in good health.          Review of Systems  All other systems reviewed and are negative.  Denies any current or recent chest discomfort. Has returned to work     Objective:   Vitals: BP 130/80   Pulse (!) 58   Ht 5' 1 (1.549 m)   SpO2 94%   BMI 44.93 kg/m    Physical Exam Vitals and nursing note reviewed.  Constitutional:      General: She is not in acute distress.    Appearance: Normal appearance. She is not toxic-appearing.  HENT:     Head: Normocephalic and atraumatic.  Neck:     Thyroid: No thyroid mass, thyromegaly or thyroid tenderness.     Vascular: No carotid bruit.  Cardiovascular:     Rate and Rhythm: Normal rate and regular rhythm. No extrasystoles are present.    Pulses: Normal pulses.     Heart sounds: Normal heart sounds. No murmur heard.    No friction rub. No gallop.  Pulmonary:     Effort: Pulmonary effort is normal. No respiratory distress.     Breath sounds: Normal breath sounds. No wheezing or rales.  Skin:    General: Skin is warm and dry.  Neurological:     Mental Status: She is alert and oriented to person, place, and time. Mental status is at baseline.  Psychiatric:        Mood and Affect: Mood normal.        Behavior: Behavior normal.        Thought Content: Thought content normal.        Judgment: Judgment normal.       Results:    Labs:       Component Value Date/Time   NA 144 07/22/2023 1053   NA 144 10/01/2022 1046   K 4.6 07/22/2023 1053   CL 106 07/22/2023 1053   CO2 31 07/22/2023 1053   GLUCOSE 99 07/22/2023 1053   BUN 14 07/22/2023 1053   BUN 13 10/01/2022 1046   CREATININE 0.68 07/22/2023 1053   CALCIUM  9.8 07/22/2023 1053   PROT 7.3 07/22/2023 1053   PROT 7.0 10/01/2022 1046   ALBUMIN 4.6 10/01/2022 1046   AST 16 07/22/2023 1053   ALT 42 (H) 07/22/2023 1053   ALKPHOS 110 10/01/2022 1046   BILITOT 0.7 07/22/2023 1053   BILITOT 0.6 10/01/2022 1046   GFRNONAA >60 09/06/2022  1204   GFRNONAA 99 01/25/2020 0931   GFRAA 115 01/25/2020 0931     Lab Results  Component Value Date   WBC 6.4 07/22/2023   HGB 12.9 07/22/2023   HCT 38.1 07/22/2023   MCV 87.4 07/22/2023  PLT 253 07/22/2023    Lab Results  Component Value Date   CHOL 105 07/22/2023   HDL 43 (L) 07/22/2023   LDLCALC 45 07/22/2023   TRIG 88 07/22/2023   CHOLHDL 2.4 07/22/2023    Lab Results  Component Value Date   HGBA1C 6.1 (H) 10/01/2022     Lab Results  Component Value Date   TSH 3.51 07/22/2023        Assessment & Plan:   Hordeolum: On 02/15/2024 she went to the urgent care for hordeolum. She had mild swelling In her right eye that progressed to blurred vision and eye pain. She was prescribed Erythromycin ointment. Her Blood pressure was 151/80 and 174/70 on recheck. She was advised to follow up with her primary care physician and go to the ER if she was having chest pain.   Precordial Pain: On 02/16/2024 she was watching TV she felt the sudden onset of left arm pain and nausea. She presented to the ED with complaints of fatigue, Shortness of breath, Nausea, diarrhea and a pain down her left arm. CBC unremarkable for leukocytosis, leukopenia, anemia, thrombocytopenia. CMP unremarkable for electrolyte abnormality, AKI, liver injury. She was given Zofran  4 mg injection, Nitroglycerin  0.4 mg tablet and Aspirin  243 mg. Cardiac stress test Medium size, mild intensity reversible changes are seen mid to apical anterior septal wall. Likely artifactual changes in the inferior and lateral walls with improved perfusion during stress. She felt stable a day later and was discharged.      Referred to Cardiology, Dr. Mona whom patient saw during admission for chest pain in October 2018. Had myocardial perfusion study at the time.  Hypertension: Edema treated with Amlodipine  5 mg and HCTZ 25 mg daily or Furosemide  20 mg. Blood Pressure today is normal at 130/80. History of Hypokalemia treated with  Klor-con  40 meq daily. History of Ventricular Bigeminy; Palpitations treated with Bystolic  10 mg daily. Ventricular Bigeminy was diagnosed February 2012 during an evaluation in the emergency department for chest pain and palpitations.    Hyperlipidemia: treated with Atorvastatin  40 mg daily.    History of Impaired glucose tolerance.   History of Obesity. BMI 44.93   Anxiety/Depression; Situational stress: treated with Zoloft  100 mg daily.    Xanax  0.25 mg prescribed.   GE Reflux: treated with Pantoprazole  40 mg.   Obstructive Sleep Apnea: treated with CPAP device.    I,Makayla C Reid,acting as a scribe for Ronal JINNY Hailstone, MD.,have documented all relevant documentation on the behalf of Ronal JINNY Hailstone, MD,as directed by  Ronal JINNY Hailstone, MD while in the presence of Ronal JINNY Hailstone, MD.    I, Ronal JINNY Hailstone, MD, have reviewed all documentation for this visit. The documentation on 02/19/2024 for the exam, diagnosis, procedures, and orders are all accurate and complete.

## 2024-02-23 ENCOUNTER — Encounter: Payer: Self-pay | Admitting: Internal Medicine

## 2024-02-23 MED ORDER — ALPRAZOLAM 0.25 MG PO TABS: 0.2500 mg | ORAL_TABLET | Freq: Two times a day (BID) | ORAL | 1 refills | Status: AC | PRN

## 2024-02-23 NOTE — Patient Instructions (Addendum)
 Patient here for hospital follow up. Has seen Dr. Mona in the remote past.Will make referral to see him again. Recently, was hospitalized for chest pain at [facility]. MI was ruled out. Had abnormal perfusion study. Needs Cardiology follow up. Had recent hordeolum which has resolved with conservative management. BMI 44 needs attention. Used to attend [facility]. Hx hypertension longstanding. Continue antihypertension medications. Continue Atorvastatin .Recent Hgb AIC was normal.

## 2024-04-24 ENCOUNTER — Other Ambulatory Visit: Payer: Self-pay | Admitting: Internal Medicine

## 2024-07-01 ENCOUNTER — Ambulatory Visit: Admitting: Internal Medicine

## 2024-07-27 ENCOUNTER — Other Ambulatory Visit: Payer: Self-pay

## 2024-07-30 ENCOUNTER — Encounter: Payer: Self-pay | Admitting: Internal Medicine
